# Patient Record
Sex: Male | Born: 1950 | Race: Black or African American | Hispanic: No | Marital: Single | Smoking: Former smoker
Health system: Southern US, Community
[De-identification: ages and names within clinical notes are randomized; demographics above are authoritative.]

## PROBLEM LIST (undated history)

## (undated) DIAGNOSIS — M1990 Unspecified osteoarthritis, unspecified site: Secondary | ICD-10-CM

## (undated) DIAGNOSIS — C189 Malignant neoplasm of colon, unspecified: Secondary | ICD-10-CM

## (undated) DIAGNOSIS — D649 Anemia, unspecified: Secondary | ICD-10-CM

## (undated) DIAGNOSIS — I10 Essential (primary) hypertension: Secondary | ICD-10-CM

## (undated) DIAGNOSIS — E785 Hyperlipidemia, unspecified: Secondary | ICD-10-CM

## (undated) DIAGNOSIS — R7303 Prediabetes: Secondary | ICD-10-CM

## (undated) HISTORY — PX: HERNIA REPAIR: SHX51

## (undated) HISTORY — PX: TONSILLECTOMY: SUR1361

## (undated) HISTORY — DX: Anemia, unspecified: D64.9

---

## 2021-01-22 DIAGNOSIS — C189 Malignant neoplasm of colon, unspecified: Secondary | ICD-10-CM

## 2021-01-22 HISTORY — DX: Malignant neoplasm of colon, unspecified: C18.9

## 2021-01-29 ENCOUNTER — Other Ambulatory Visit: Payer: Self-pay | Admitting: Registered Nurse

## 2021-01-29 DIAGNOSIS — R197 Diarrhea, unspecified: Secondary | ICD-10-CM

## 2021-02-01 ENCOUNTER — Other Ambulatory Visit: Payer: Self-pay

## 2021-02-01 ENCOUNTER — Ambulatory Visit
Admission: RE | Admit: 2021-02-01 | Discharge: 2021-02-01 | Disposition: A | Discharge status: Home / Self Care | Payer: Medicare HMO | Source: Ambulatory Visit | Attending: Registered Nurse | Admitting: Registered Nurse

## 2021-02-01 DIAGNOSIS — R197 Diarrhea, unspecified: Secondary | ICD-10-CM

## 2021-03-01 ENCOUNTER — Ambulatory Visit: Payer: Self-pay | Admitting: General Surgery

## 2021-03-01 NOTE — H&P (Signed)
REFERRING PHYSICIAN:  Koleen Distance, MD  PROVIDER:  Monico Blitz, MD  MRN: [id number] DOB: 08/04/1950 DATE OF ENCOUNTER: 03/01/2021  Subjective   Chief Complaint: New Consultation     History of Present Illness: Tyler Wise is a 70 y.o. male who is seen today as an office consultation at the request of Dr. Alan Ripper for evaluation of colon cancer.  Patient underwent a CT scan of the abdomen pelvis due to nausea vomiting and diarrhea associated with weight loss.  This showed a mass in the right colon.  He was sent for colonoscopy.  Biopsies show adenocarcinoma.  Mass was located in the ascending colon and tattooed distally.  There appears to also be a 5 cm polyp in the descending colon approximately 50 cm from the anal verge.  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  This was tattooed proximally and distally.  Patient also has a umbilical hernia on CT scan.  It does not appear that he has had a CEA level or CT of his chest.  He states that he has had almost a 50 pound weight loss since January.  He has had 2 months of diarrhea.  He has an appointment with a hematologist next week for further evaluation.     Review of Systems: A complete review of systems was obtained from the patient.  I have reviewed this information and discussed as appropriate with the patient.  See HPI as well for other ROS.    Medical History: Past Medical History:  Diagnosis Date   History of cancer     Patient Active Problem List  Diagnosis   Benign prostatic hyperplasia   Edema   Essential hypertension   Gastroesophageal reflux disease without esophagitis   Hyperlipidemia   Iron deficiency anemia   Need for hepatitis C screening test   Need for immunization against influenza   Need for shingles vaccine   Screen for colon cancer   Stage 3 chronic kidney disease (CMS-HCC)    History reviewed. No pertinent surgical history.   No Known  Allergies  Current Outpatient Medications on File Prior to Visit  Medication Sig Dispense Refill   amLODIPine (NORVASC) 5 MG tablet Take by mouth     aspirin 81 MG EC tablet Take by mouth     atorvastatin (LIPITOR) 10 MG tablet Take by mouth     omeprazole (PRILOSEC) 20 MG DR capsule Take by mouth     ondansetron (ZOFRAN) 4 MG tablet      tamsulosin (FLOMAX) 0.4 mg capsule Take by mouth     No current facility-administered medications on file prior to visit.    History reviewed. No pertinent family history.   Social History   Tobacco Use  Smoking Status Never Smoker  Smokeless Tobacco Never Used     Social History   Socioeconomic History   Marital status: Single  Tobacco Use   Smoking status: Never Smoker   Smokeless tobacco: Never Used  Scientific laboratory technician Use: Never used  Substance and Sexual Activity   Alcohol use: Not Currently   Drug use: Defer    Objective:    Vitals:   03/01/21 1009  BP: 122/78  Pulse: (!) 115  Temp: 36.5 C (97.7 F)  Weight: 95.3 kg (210 lb 3.2 oz)  Height: 175.3 cm ('5\' 9"'$ )     Exam Gen: NAD Abd: soft, periumbilical hernia, reducible    Labs, Imaging and Diagnostic Testing: CT abd and pelvis reviewed.  This shows a large mass in the ascending colon just proximal to the hepatic flexure.  There does not appear to be any involvement of the duodenum or Glisson's capsule.  Assessment and Plan:  Diagnoses and all orders for this visit:  Malignant neoplasm of ascending colon (CMS-HCC) 70 year old male who presents to the office with a new ascending colon cancer.  I have recommended laparoscopic partial colectomy for resection.  We have discussed this in detail including risk of bleeding, infection and damage to adjacent structures.  We will plan on doing the surgery through his umbilical hernia and repair this primarily at the end of the procedure.  We discussed approximately 50% recurrence rate of his hernia.  We will get a CT scan of  his chest and a CEA level prior to surgery to complete his metastatic work-up. The surgery and anatomy were described to the patient as well as the risks of surgery and the possible complications.  These include: Bleeding, deep abdominal infections and possible wound complications such as hernia and infection, damage to adjacent structures, leak of surgical connections, which can lead to other surgeries and possibly an ostomy, possible need for other procedures, such as abscess drains in radiology, possible prolonged hospital stay, possible diarrhea from removal of part of the colon, possible constipation from narcotics, possible bowel, bladder or sexual dysfunction if having rectal surgery, prolonged fatigue/weakness or appetite loss, possible early recurrence of of disease, possible complications of their medical problems such as heart disease or arrhythmias or lung problems, death (less than 1%). I believe the patient understands and wishes to proceed with the surgery.  Adenomatous polyp of descending colon  Patient also has a 5 cm polyp of the descending colon.  There is no sign of malignancy on pathology.  He does have high-grade dysplasia.  It appears in discussing with the patient that he is being evaluated for possible endoscopic resection.  I think this is reasonable to attempt.  If for some reason this cannot be completed, we can proceed with a repeat colonic resection of the left side approximately 3 to 6 months after surgery.

## 2021-03-01 NOTE — H&P (View-Only) (Signed)
REFERRING PHYSICIAN:  Koleen Distance, MD  PROVIDER:  Monico Blitz, MD  MRN: [id number] DOB: [birth year] DATE OF ENCOUNTER: 03/01/2021  Subjective   Chief Complaint: New Consultation     History of Present Illness: Tyler Wise is a 70 y.o. male who is seen today as an office consultation at the request of Dr. Alan Ripper for evaluation of colon cancer.  Patient underwent a CT scan of the abdomen pelvis due to nausea vomiting and diarrhea associated with weight loss.  This showed a mass in the right colon.  He was sent for colonoscopy.  Biopsies show adenocarcinoma.  Mass was located in the ascending colon and tattooed distally.  There appears to also be a 5 cm polyp in the descending colon approximately 50 cm from the anal verge.  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  This was tattooed proximally and distally.  Patient also has a umbilical hernia on CT scan.  It does not appear that he has had a CEA level or CT of his chest.  He states that he has had almost a 50 pound weight loss since January.  He has had 2 months of diarrhea.  He has an appointment with a hematologist next week for further evaluation.     Review of Systems: A complete review of systems was obtained from the patient.  I have reviewed this information and discussed as appropriate with the patient.  See HPI as well for other ROS.    Medical History: Past Medical History:  Diagnosis Date   History of cancer     Patient Active Problem List  Diagnosis   Benign prostatic hyperplasia   Edema   Essential hypertension   Gastroesophageal reflux disease without esophagitis   Hyperlipidemia   Iron deficiency anemia   Need for hepatitis C screening test   Need for immunization against influenza   Need for shingles vaccine   Screen for colon cancer   Stage 3 chronic kidney disease (CMS-HCC)    History reviewed. No pertinent surgical history.   No Known  Allergies  Current Outpatient Medications on File Prior to Visit  Medication Sig Dispense Refill   amLODIPine (NORVASC) 5 MG tablet Take by mouth     aspirin 81 MG EC tablet Take by mouth     atorvastatin (LIPITOR) 10 MG tablet Take by mouth     omeprazole (PRILOSEC) 20 MG DR capsule Take by mouth     ondansetron (ZOFRAN) 4 MG tablet      tamsulosin (FLOMAX) 0.4 mg capsule Take by mouth     No current facility-administered medications on file prior to visit.    History reviewed. No pertinent family history.   Social History   Tobacco Use  Smoking Status Never Smoker  Smokeless Tobacco Never Used     Social History   Socioeconomic History   Marital status: Single  Tobacco Use   Smoking status: Never Smoker   Smokeless tobacco: Never Used  Scientific laboratory technician Use: Never used  Substance and Sexual Activity   Alcohol use: Not Currently   Drug use: Defer    Objective:    Vitals:   03/01/21 1009  BP: 122/78  Pulse: (!) 115  Temp: 36.5 C (97.7 F)  Weight: 95.3 kg (210 lb 3.2 oz)  Height: 175.3 cm ('5\' 9"'$ )     Exam Gen: NAD Abd: soft, periumbilical hernia, reducible    Labs, Imaging and Diagnostic Testing: CT abd and pelvis reviewed.  This shows a large mass in the ascending colon just proximal to the hepatic flexure.  There does not appear to be any involvement of the duodenum or Glisson's capsule.  Assessment and Plan:  Diagnoses and all orders for this visit:  Malignant neoplasm of ascending colon (CMS-HCC) 70 year old male who presents to the office with a new ascending colon cancer.  I have recommended laparoscopic partial colectomy for resection.  We have discussed this in detail including risk of bleeding, infection and damage to adjacent structures.  We will plan on doing the surgery through his umbilical hernia and repair this primarily at the end of the procedure.  We discussed approximately 50% recurrence rate of his hernia.  We will get a CT scan of  his chest and a CEA level prior to surgery to complete his metastatic work-up. The surgery and anatomy were described to the patient as well as the risks of surgery and the possible complications.  These include: Bleeding, deep abdominal infections and possible wound complications such as hernia and infection, damage to adjacent structures, leak of surgical connections, which can lead to other surgeries and possibly an ostomy, possible need for other procedures, such as abscess drains in radiology, possible prolonged hospital stay, possible diarrhea from removal of part of the colon, possible constipation from narcotics, possible bowel, bladder or sexual dysfunction if having rectal surgery, prolonged fatigue/weakness or appetite loss, possible early recurrence of of disease, possible complications of their medical problems such as heart disease or arrhythmias or lung problems, death (less than 1%). I believe the patient understands and wishes to proceed with the surgery.  Adenomatous polyp of descending colon  Patient also has a 5 cm polyp of the descending colon.  There is no sign of malignancy on pathology.  He does have high-grade dysplasia.  It appears in discussing with the patient that he is being evaluated for possible endoscopic resection.  I think this is reasonable to attempt.  If for some reason this cannot be completed, we can proceed with a repeat colonic resection of the left side approximately 3 to 6 months after surgery.

## 2021-03-04 NOTE — Patient Instructions (Addendum)
DUE TO COVID-19 ONLY ONE VISITOR IS ALLOWED TO COME WITH YOU AND STAY IN THE WAITING ROOM ONLY DURING PRE OP AND PROCEDURE.   **NO VISITORS ARE ALLOWED IN THE SHORT STAY AREA OR RECOVERY ROOM!!**  IF YOU WILL BE ADMITTED INTO THE HOSPITAL YOU ARE ALLOWED ONLY TWO SUPPORT PEOPLE DURING VISITATION HOURS ONLY (10AM -8PM)   The support person(s) may change daily. The support person(s) must pass our screening, gel in and out, and wear a mask at all times, including in the patient's room. Patients must also wear a mask when staff or their support person are in the room.  No visitors under the age of 86. Any visitor under the age of 2 must be accompanied by an adult.    COVID SWAB TESTING MUST BE COMPLETED ON:  Monday 03-08-21 between the hours of 8 and 3 **MUST PRESENT COMPLETED FORM AT TESTING SITE**    Tyler Wise (backside of the building)  You are not required to quarantine, however you are required to wear a well-fitted mask when you are out and around people not in your household.  Hand Hygiene often Do NOT share personal items Notify your provider if you are in close contact with someone who has COVID or you develop fever 100.4 or greater, new onset of sneezing, cough, sore throat, shortness of breath or body aches.   Your procedure is scheduled on:  Wednesday, 03-10-21   Report to [facility] Main  Entrance   Report to admitting at 9:30 AM   Call this number if you have problems the morning of surgery [phone]   Follow prep instructions from surgeon's office   Do not eat food :After Midnight.   May have liquids until 8:30 AM day of surgery  CLEAR LIQUID DIET  Foods Allowed                                                                     Foods Excluded  Water, Black Coffee and tea, regular and decaf               liquids that you cannot  Plain Jell-O in any flavor  (No red)                                    see through such as: Fruit  ices (not with fruit pulp)                                      milk, soups, orange juice              Iced Popsicles (No red)                                      All solid food                                   Apple juices  Sports drinks like Gatorade (No red) Lightly seasoned clear broth or consume(fat free) Sugar, honey syrup       Drink 2 Ensure drinks the night before surgery, have completed by 10 PM.    Complete one Ensure drink the morning of surgery 3 hours prior to scheduled surgery, have completed by 8:30 AM.     The day of surgery:  Drink ONE (1) Pre-Surgery Clear Ensure the morning of surgery. Drink in one sitting. Do not sip.  This drink was given to you during your hospital  pre-op appointment visit. Nothing else to drink after completing the Pre-Surgery Clear Ensure .          If you have questions, please contact your surgeon's office.     Oral Hygiene is also important to reduce your risk of infection.                                    Remember - BRUSH YOUR TEETH THE MORNING OF SURGERY WITH YOUR REGULAR TOOTHPASTE   Do NOT smoke after Midnight   Take these medicines the morning of surgery with A SIP OF WATER:  Omeprazole, Tamsulosin                              You may not have any metal on your body including jewelry, and body piercing             Do not wear lotions, powders, perfumes/cologne, or deodorant              Men may shave face and neck.   Do not bring valuables to the hospital. Tyler Wise.   Contacts, dentures or bridgework may not be worn into surgery.   Bring small overnight bag day of surgery.    Please read over the following fact sheets you were given: IF YOU HAVE QUESTIONS ABOUT YOUR PRE OP INSTRUCTIONS PLEASE CALL [facility] - Preparing for Surgery Before surgery, you can play an important role.  Because skin is not sterile, your skin needs to be as free of germs as  possible.  You can reduce the number of germs on your skin by washing with CHG (chlorahexidine gluconate) soap before surgery.  CHG is an antiseptic cleaner which kills germs and bonds with the skin to continue killing germs even after washing. Please DO NOT use if you have an allergy to CHG or antibacterial soaps.  If your skin becomes reddened/irritated stop using the CHG and inform your nurse when you arrive at Short Stay. Do not shave (including legs and underarms) for at least 48 hours prior to the first CHG shower.  You may shave your face/neck.  Please follow these instructions carefully:  1.  Shower with CHG Soap the night before surgery and the  morning of surgery.  2.  If you choose to wash your hair, wash your hair first as usual with your normal  shampoo.  3.  After you shampoo, rinse your hair and body thoroughly to remove the shampoo.                             4.  Use CHG as you would any other liquid soap.  You can apply chg directly to  the skin and wash.  Gently with a scrungie or clean washcloth.  5.  Apply the CHG Soap to your body ONLY FROM THE NECK DOWN.   Do   not use on face/ open                           Wound or open sores. Avoid contact with eyes, ears mouth and   genitals (private parts).                       Wash face,  Genitals (private parts) with your normal soap.             6.  Wash thoroughly, paying special attention to the area where your    surgery  will be performed.  7.  Thoroughly rinse your body with warm water from the neck down.  8.  DO NOT shower/wash with your normal soap after using and rinsing off the CHG Soap.                9.  Pat yourself dry with a clean towel.            10.  Wear clean pajamas.            11.  Place clean sheets on your bed the night of your first shower and do not  sleep with pets. Day of Surgery : Do not apply any lotions/deodorants the morning of surgery.  Please wear clean clothes to the hospital/surgery  center.  FAILURE TO FOLLOW THESE INSTRUCTIONS MAY RESULT IN THE CANCELLATION OF YOUR SURGERY  PATIENT SIGNATURE_________________________________  NURSE SIGNATURE__________________________________  ________________________________________________________________________  WHAT IS A BLOOD TRANSFUSION? Blood Transfusion Information  A transfusion is the replacement of blood or some of its parts. Blood is made up of multiple cells which provide different functions. Red blood cells carry oxygen and are used for blood loss replacement. White blood cells fight against infection. Platelets control bleeding. Plasma helps clot blood. Other blood products are available for specialized needs, such as hemophilia or other clotting disorders. BEFORE THE TRANSFUSION  Who gives blood for transfusions?  Healthy volunteers who are fully evaluated to make sure their blood is safe. This is blood bank blood. Transfusion therapy is the safest it has ever been in the practice of medicine. Before blood is taken from a donor, a complete history is taken to make sure that person has no history of diseases nor engages in risky social behavior (examples are intravenous drug use or sexual activity with multiple partners). The donor's travel history is screened to minimize risk of transmitting infections, such as malaria. The donated blood is tested for signs of infectious diseases, such as HIV and hepatitis. The blood is then tested to be sure it is compatible with you in order to minimize the chance of a transfusion reaction. If you or a relative donates blood, this is often done in anticipation of surgery and is not appropriate for emergency situations. It takes many days to process the donated blood. RISKS AND COMPLICATIONS Although transfusion therapy is very safe and saves many lives, the main dangers of transfusion include:  Getting an infectious disease. Developing a transfusion reaction. This is an allergic  reaction to something in the blood you were given. Every precaution is taken to prevent this. The decision to have a blood transfusion has been considered carefully by your caregiver before blood is given. Blood is not  given unless the benefits outweigh the risks. AFTER THE TRANSFUSION Right after receiving a blood transfusion, you will usually feel much better and more energetic. This is especially true if your red blood cells have gotten low (anemic). The transfusion raises the level of the red blood cells which carry oxygen, and this usually causes an energy increase. The nurse administering the transfusion will monitor you carefully for complications. HOME CARE INSTRUCTIONS  No special instructions are needed after a transfusion. You may find your energy is better. Speak with your caregiver about any limitations on activity for underlying diseases you may have. SEEK MEDICAL CARE IF:  Your condition is not improving after your transfusion. You develop redness or irritation at the intravenous (IV) site. SEEK IMMEDIATE MEDICAL CARE IF:  Any of the following symptoms occur over the next 12 hours: Shaking chills. You have a temperature by mouth above 102 F (38.9 C), not controlled by medicine. Chest, back, or muscle pain. People around you feel you are not acting correctly or are confused. Shortness of breath or difficulty breathing. Dizziness and fainting. You get a rash or develop hives. You have a decrease in urine output. Your urine turns a dark color or changes to pink, red, or brown. Any of the following symptoms occur over the next 10 days: You have a temperature by mouth above 102 F (38.9 C), not controlled by medicine. Shortness of breath. Weakness after normal activity. The white part of the eye turns yellow (jaundice). You have a decrease in the amount of urine or are urinating less often. Your urine turns a dark color or changes to pink, red, or brown. Document Released:  07/08/2000 Document Revised: 10/03/2011 Document Reviewed: 02/25/2008 [facility] Patient Information 2014 [place], [place].  _______________________________________________________________________

## 2021-03-05 ENCOUNTER — Other Ambulatory Visit: Payer: Self-pay | Admitting: General Surgery

## 2021-03-05 ENCOUNTER — Other Ambulatory Visit (HOSPITAL_COMMUNITY): Payer: Self-pay

## 2021-03-05 DIAGNOSIS — C182 Malignant neoplasm of ascending colon: Secondary | ICD-10-CM

## 2021-03-08 ENCOUNTER — Other Ambulatory Visit: Payer: Self-pay | Admitting: General Surgery

## 2021-03-08 ENCOUNTER — Encounter (HOSPITAL_COMMUNITY)
Admission: RE | Admit: 2021-03-08 | Discharge: 2021-03-08 | Disposition: A | Discharge status: Home / Self Care | Payer: Medicare HMO | Source: Ambulatory Visit | Attending: General Surgery | Admitting: General Surgery

## 2021-03-08 ENCOUNTER — Encounter (HOSPITAL_COMMUNITY): Payer: Self-pay

## 2021-03-08 ENCOUNTER — Other Ambulatory Visit: Payer: Self-pay

## 2021-03-08 DIAGNOSIS — C189 Malignant neoplasm of colon, unspecified: Secondary | ICD-10-CM | POA: Insufficient documentation

## 2021-03-08 DIAGNOSIS — Z7982 Long term (current) use of aspirin: Secondary | ICD-10-CM | POA: Insufficient documentation

## 2021-03-08 DIAGNOSIS — I10 Essential (primary) hypertension: Secondary | ICD-10-CM | POA: Insufficient documentation

## 2021-03-08 DIAGNOSIS — I700 Atherosclerosis of aorta: Secondary | ICD-10-CM | POA: Insufficient documentation

## 2021-03-08 DIAGNOSIS — R000 Tachycardia, unspecified: Secondary | ICD-10-CM | POA: Insufficient documentation

## 2021-03-08 DIAGNOSIS — Z79899 Other long term (current) drug therapy: Secondary | ICD-10-CM | POA: Insufficient documentation

## 2021-03-08 DIAGNOSIS — R7303 Prediabetes: Secondary | ICD-10-CM | POA: Insufficient documentation

## 2021-03-08 DIAGNOSIS — Z01812 Encounter for preprocedural laboratory examination: Secondary | ICD-10-CM | POA: Insufficient documentation

## 2021-03-08 HISTORY — DX: Essential (primary) hypertension: I10

## 2021-03-08 HISTORY — DX: Prediabetes: R73.03

## 2021-03-08 HISTORY — DX: Hyperlipidemia, unspecified: E78.5

## 2021-03-08 HISTORY — DX: Malignant neoplasm of colon, unspecified: C18.9

## 2021-03-08 LAB — BASIC METABOLIC PANEL
Anion gap: 9 (ref 5–15)
BUN: 10 mg/dL (ref 8–23)
CO2: 26 mmol/L (ref 22–32)
Calcium: 8.4 mg/dL — ABNORMAL LOW (ref 8.9–10.3)
Chloride: 105 mmol/L (ref 98–111)
Creatinine, Ser: 1.45 mg/dL — ABNORMAL HIGH (ref 0.61–1.24)
GFR, Estimated: 52 mL/min — ABNORMAL LOW (ref >60)
Glucose, Bld: 138 mg/dL — ABNORMAL HIGH (ref 70–99)
Potassium: 3.5 mmol/L (ref 3.5–5.1)
Sodium: 140 mmol/L (ref 135–145)

## 2021-03-08 LAB — CBC
HCT: 42.8 % (ref 39.0–52.0)
Hemoglobin: 13.1 g/dL (ref 13.0–17.0)
MCH: 21.6 pg — ABNORMAL LOW (ref 26.0–34.0)
MCHC: 30.6 g/dL (ref 30.0–36.0)
MCV: 70.6 fL — ABNORMAL LOW (ref 80.0–100.0)
Platelets: 611 10*3/uL — ABNORMAL HIGH (ref 150–400)
RBC: 6.06 MIL/uL — ABNORMAL HIGH (ref 4.22–5.81)
RDW: 22.2 % — ABNORMAL HIGH (ref 11.5–15.5)
WBC: 13.9 10*3/uL — ABNORMAL HIGH (ref 4.0–10.5)
nRBC: 0 % (ref 0.0–0.2)

## 2021-03-08 LAB — SARS CORONAVIRUS 2 (TAT 6-24 HRS): SARS Coronavirus 2: NEGATIVE

## 2021-03-08 LAB — HEMOGLOBIN A1C
Hgb A1c MFr Bld: 6.3 % — ABNORMAL HIGH (ref 4.8–5.6)
Mean Plasma Glucose: 134.11 mg/dL

## 2021-03-08 NOTE — Progress Notes (Signed)
Anesthesia Consult:    Case: [id number] Date/Time: 03/10/21 1115   Procedure: LAPAROSCOPIC PARTIAL COLECTOMY   Anesthesia type: General   Pre-op diagnosis: colon cancer   Location: [facility] ROOM 02 / [facility]   Surgeons: Leighton Ruff, MD       DISCUSSION: Pt is 70 years old with hx HTN, pre-diabetes  Asked to see pt in presurgical testing for tachycardia, hypotension, and c/o dizziness. Pt reports did not wake up feeling dizzy. Woke up, took AM meds (including amlodipine), showered, did not eat. Daughter arrived to take him to 8am presurgical testing appointment and pt reports starting to feel dizzy at that time. Has been having episodes of dizziness lately. He reports his systolic BP used to be [id number] but has been much lower recently. Does not check BP at home. Denies falls. Has had significant weight loss in recent months (50 pounds since 07/2020) and has recurrent diarrhea and vomiting for the last 2 months. Denies chest pain or shortness of breath.   BP low at 95/63 and 94/54 on recheck. Pt symptoms improved slightly after drinking 2 small cans of ginger ale but pt still feels dizzy. I suspect his amlodipine is the main culprit for this morning's symptoms. Not eating likely also plays a factor. Asked him to contact his PCP about holding amlodipine for the short term. Reviewed with pt and daughter reasons to seek urgent/emergent care. Cautioned pt to be careful changing positions and ambulating.   On exam, HR tachycardic with no abnormal heart sounds. Lungs CTA B. Pt is alert and oriented x3.   Reviewed pt and EKG with Dr. Deatra Canter.    VS: BP 95/63   Pulse (!) 106   Temp 36.6 C (Oral)   Resp 18   Ht '5\' 9"'$  (1.753 m)   SpO2 99%   PROVIDERS: - PCP is Arthur Holms, NP   LABS: Labs reviewed: Acceptable for surgery. (all labs ordered are listed, but only abnormal results are displayed)  Labs Reviewed  HEMOGLOBIN A1C - Abnormal; Notable for the following components:      Result Value    Hgb A1c MFr Bld 6.3 (*)    All other components within normal limits  BASIC METABOLIC PANEL - Abnormal; Notable for the following components:   Glucose, Bld 138 (*)    Creatinine, Ser 1.45 (*)    Calcium 8.4 (*)    GFR, Estimated 52 (*)    All other components within normal limits  CBC - Abnormal; Notable for the following components:   WBC 13.9 (*)    RBC 6.06 (*)    MCV 70.6 (*)    MCH 21.6 (*)    RDW 22.2 (*)    Platelets 611 (*)    All other components within normal limits  CEA  TYPE AND SCREEN     IMAGES: CT chest 03/09/21:  1. No specific findings identified to suggest metastatic disease to the chest. 2. Tiny 3 mm nodule within the posterior right lower lobe is identified. Although nonspecific attention in this nodule on future surveillance imaging is advised. 3. Partially visualized right colon mass is again seen. 4. Aortic atherosclerosis.   EKG 03/08/21: Sinus tachycardia with PACs. Left axis deviation Inferior infarct, age undetermined Anterior infarct, age undetermined - No prior EKG for comparison  CV: N/A  Past Medical History:  Diagnosis Date   Colon cancer ([place])    Hyperlipidemia    Hypertension    Pre-diabetes     History reviewed. No pertinent  surgical history.  MEDICATIONS:  amLODipine (NORVASC) 5 MG tablet   aspirin EC 81 MG tablet   atorvastatin (LIPITOR) 10 MG tablet   bisacodyl (DULCOLAX) 5 MG EC tablet   omeprazole (PRILOSEC) 40 MG capsule   ondansetron (ZOFRAN) 4 MG tablet   tamsulosin (FLOMAX) 0.4 MG CAPS capsule   No current facility-administered medications for this encounter.    If no changes, I anticipate pt can proceed with surgery as scheduled.   Willeen Cass, PhD, FNP-BC [facility]/Anesthesiology Phone: [phone] 03/09/2021 11:53 AM

## 2021-03-08 NOTE — Progress Notes (Addendum)
COVID swab appointment:  COVID Vaccine Completed:  Yes x2 Date COVID Vaccine completed:  12-01-19 Has received booster:  Yes x1 06-23-20 COVID vaccine manufacturer:   [employer]   Date of COVID positive in last 90 days:  No  PCP - Arthur Holms, NP Cardiologist - N/A  Chest x-ray - N/A EKG - 03-08-21 Epic Stress Test - N/A ECHO - N/A Cardiac Cath - N/A Pacemaker/ICD device last checked: Spinal Cord Stimulator:  Sleep Study - N/A CPAP -   Fasting Blood Sugar - N/A Checks Blood Sugar _____ times a day  Blood Thinner Instructions: Aspirin Instructions:  ASA 81 mg.  Last dose 03-08-21 Last Dose:  Activity level:  Can go up a flight of stairs and perform activities of daily living without stopping and without symptoms of chest pain or shortness of breath.  Patient lives alone.      Anesthesia review:   Patient's BP 94/54 at PAT appointment, patient complained of dizziness and was diaphoretic and complained of nausea.  Patient examined by Willeen Cass, NP.  Patient drank two ginger ale and started to feel better.  BP 95/63 when rechecked, but patient stated that he felt significantly better and stated that nausea and dizziness had resolved.  Was able to ambulate to wheelchair without complaints.  Patient complains of shortness of breath while wearing mask but states that he does not have SOB normally.  Fever, cough and chest pain at PAT appointment   Patient verbalized understanding of instructions that were given to them at the PAT appointment. Patient was also instructed that they will need to review over the PAT instructions again at home before surgery.

## 2021-03-09 ENCOUNTER — Ambulatory Visit (HOSPITAL_COMMUNITY)
Admission: RE | Admit: 2021-03-09 | Discharge: 2021-03-09 | Disposition: A | Discharge status: Home / Self Care | Payer: Medicare HMO | Source: Ambulatory Visit | Attending: General Surgery | Admitting: General Surgery

## 2021-03-09 ENCOUNTER — Encounter (HOSPITAL_COMMUNITY): Payer: Self-pay

## 2021-03-09 DIAGNOSIS — C182 Malignant neoplasm of ascending colon: Secondary | ICD-10-CM | POA: Insufficient documentation

## 2021-03-09 LAB — CEA: CEA: 29.4 ng/mL — ABNORMAL HIGH (ref 0.0–4.7)

## 2021-03-09 MED ORDER — IOHEXOL 350 MG/ML SOLN
60.0000 mL | Freq: Once | INTRAVENOUS | Status: AC | PRN
  Administered 2021-03-09: 60 mL via INTRAVENOUS

## 2021-03-09 MED ORDER — BUPIVACAINE LIPOSOME 1.3 % IJ SUSP
20.0000 mL | Freq: Once | INTRAMUSCULAR | Status: DC
  Filled 2021-03-09: qty 20

## 2021-03-09 NOTE — Anesthesia Preprocedure Evaluation (Addendum)
Anesthesia Evaluation  Patient identified by MRN, date of birth, ID band Patient awake    Reviewed: Allergy & Precautions, NPO status , Patient's Chart, lab work & pertinent test results  Airway Mallampati: II  TM Distance: >3 FB     Dental  (+) Dental Advisory Given   Pulmonary former smoker,    breath sounds clear to auscultation       Cardiovascular hypertension,  Rhythm:Regular Rate:Normal     Neuro/Psych negative neurological ROS     GI/Hepatic Neg liver ROS, GERD  Medicated,Colon CA   Endo/Other  negative endocrine ROS  Renal/GU Renal InsufficiencyRenal disease     Musculoskeletal   Abdominal   Peds  Hematology negative hematology ROS (+)   Anesthesia Other Findings   Reproductive/Obstetrics                            Anesthesia Physical Anesthesia Plan  ASA: 3  Anesthesia Plan: General   Post-op Pain Management:    Induction: Intravenous  PONV Risk Score and Plan: 3 and Dexamethasone, Ondansetron, Treatment may vary due to age or medical condition and Midazolam  Airway Management Planned: Oral ETT  Additional Equipment: None  Intra-op Plan:   Post-operative Plan: Extubation in OR  Informed Consent: I have reviewed the patients History and Physical, chart, labs and discussed the procedure including the risks, benefits and alternatives for the proposed anesthesia with the patient or authorized representative who has indicated his/her understanding and acceptance.     Dental advisory given  Plan Discussed with: CRNA  Anesthesia Plan Comments:        Anesthesia Quick Evaluation

## 2021-03-10 ENCOUNTER — Encounter (HOSPITAL_COMMUNITY): Payer: Self-pay | Admitting: General Surgery

## 2021-03-10 ENCOUNTER — Other Ambulatory Visit: Payer: Self-pay

## 2021-03-10 ENCOUNTER — Inpatient Hospital Stay (HOSPITAL_COMMUNITY): Payer: Medicare HMO | Admitting: Registered Nurse

## 2021-03-10 ENCOUNTER — Inpatient Hospital Stay (HOSPITAL_COMMUNITY)
Admission: RE | Admit: 2021-03-10 | Discharge: 2021-03-15 | DRG: 330 | Disposition: A | Discharge status: Home / Self Care | Payer: Medicare HMO | Attending: General Surgery | Admitting: General Surgery

## 2021-03-10 ENCOUNTER — Encounter (HOSPITAL_COMMUNITY)
Admission: RE | Disposition: A | Discharge status: Home / Self Care | Payer: Self-pay | Source: Home / Self Care | Attending: General Surgery

## 2021-03-10 ENCOUNTER — Inpatient Hospital Stay (HOSPITAL_COMMUNITY): Payer: Medicare HMO | Admitting: Emergency Medicine

## 2021-03-10 DIAGNOSIS — C182 Malignant neoplasm of ascending colon: Secondary | ICD-10-CM | POA: Diagnosis present

## 2021-03-10 DIAGNOSIS — I129 Hypertensive chronic kidney disease with stage 1 through stage 4 chronic kidney disease, or unspecified chronic kidney disease: Secondary | ICD-10-CM | POA: Diagnosis present

## 2021-03-10 DIAGNOSIS — E785 Hyperlipidemia, unspecified: Secondary | ICD-10-CM | POA: Diagnosis present

## 2021-03-10 DIAGNOSIS — D124 Benign neoplasm of descending colon: Secondary | ICD-10-CM | POA: Diagnosis present

## 2021-03-10 DIAGNOSIS — Z7982 Long term (current) use of aspirin: Secondary | ICD-10-CM | POA: Diagnosis not present

## 2021-03-10 DIAGNOSIS — K420 Umbilical hernia with obstruction, without gangrene: Secondary | ICD-10-CM | POA: Diagnosis present

## 2021-03-10 DIAGNOSIS — K219 Gastro-esophageal reflux disease without esophagitis: Secondary | ICD-10-CM | POA: Diagnosis present

## 2021-03-10 DIAGNOSIS — N183 Chronic kidney disease, stage 3 unspecified: Secondary | ICD-10-CM | POA: Diagnosis present

## 2021-03-10 DIAGNOSIS — Z79899 Other long term (current) drug therapy: Secondary | ICD-10-CM

## 2021-03-10 HISTORY — PX: LAPAROSCOPIC PARTIAL COLECTOMY: SHX5907

## 2021-03-10 LAB — TYPE AND SCREEN
ABO/RH(D): A POS
Antibody Screen: NEGATIVE

## 2021-03-10 LAB — ABO/RH: ABO/RH(D): A POS

## 2021-03-10 SURGERY — LAPAROSCOPIC PARTIAL COLECTOMY
Anesthesia: General | Site: Abdomen

## 2021-03-10 MED ORDER — MIDAZOLAM HCL 5 MG/5ML IJ SOLN
INTRAMUSCULAR | Status: DC | PRN
  Administered 2021-03-10: 2 mg via INTRAVENOUS

## 2021-03-10 MED ORDER — BUPIVACAINE LIPOSOME 1.3 % IJ SUSP
INTRAMUSCULAR | Status: DC | PRN
  Administered 2021-03-10: 20 mL

## 2021-03-10 MED ORDER — EPHEDRINE SULFATE-NACL 50-0.9 MG/10ML-% IV SOSY
PREFILLED_SYRINGE | INTRAVENOUS | Status: DC | PRN
  Administered 2021-03-10 (×2): 5 mg via INTRAVENOUS

## 2021-03-10 MED ORDER — SODIUM CHLORIDE 0.9 % IV SOLN
2.0000 g | Freq: Two times a day (BID) | INTRAVENOUS | Status: AC
  Administered 2021-03-10: 2 g via INTRAVENOUS
  Filled 2021-03-10: qty 2

## 2021-03-10 MED ORDER — ROCURONIUM BROMIDE 10 MG/ML (PF) SYRINGE
PREFILLED_SYRINGE | INTRAVENOUS | Status: AC
  Filled 2021-03-10: qty 10

## 2021-03-10 MED ORDER — BUPIVACAINE-EPINEPHRINE (PF) 0.25% -1:200000 IJ SOLN
INTRAMUSCULAR | Status: AC
  Filled 2021-03-10: qty 30

## 2021-03-10 MED ORDER — PHENYLEPHRINE HCL (PRESSORS) 10 MG/ML IV SOLN
INTRAVENOUS | Status: AC
  Filled 2021-03-10: qty 2

## 2021-03-10 MED ORDER — PROPOFOL 10 MG/ML IV BOLUS
INTRAVENOUS | Status: AC
  Filled 2021-03-10: qty 20

## 2021-03-10 MED ORDER — CHLORHEXIDINE GLUCONATE 0.12 % MT SOLN
15.0000 mL | Freq: Once | OROMUCOSAL | Status: AC
  Administered 2021-03-10: 15 mL via OROMUCOSAL

## 2021-03-10 MED ORDER — ENOXAPARIN SODIUM 40 MG/0.4ML IJ SOSY
40.0000 mg | PREFILLED_SYRINGE | INTRAMUSCULAR | Status: DC
  Administered 2021-03-11 – 2021-03-15 (×5): 40 mg via SUBCUTANEOUS
  Filled 2021-03-10 (×5): qty 0.4

## 2021-03-10 MED ORDER — ACETAMINOPHEN 500 MG PO TABS
1000.0000 mg | ORAL_TABLET | ORAL | Status: AC
  Administered 2021-03-10: 1000 mg via ORAL
  Filled 2021-03-10: qty 2

## 2021-03-10 MED ORDER — MIDAZOLAM HCL 2 MG/2ML IJ SOLN
INTRAMUSCULAR | Status: AC
  Filled 2021-03-10: qty 2

## 2021-03-10 MED ORDER — GABAPENTIN 300 MG PO CAPS
300.0000 mg | ORAL_CAPSULE | Freq: Two times a day (BID) | ORAL | Status: DC
  Administered 2021-03-10 – 2021-03-15 (×10): 300 mg via ORAL
  Filled 2021-03-10 (×10): qty 1

## 2021-03-10 MED ORDER — KCL IN DEXTROSE-NACL 20-5-0.45 MEQ/L-%-% IV SOLN
INTRAVENOUS | Status: DC
  Filled 2021-03-10: qty 1000

## 2021-03-10 MED ORDER — PHENYLEPHRINE 40 MCG/ML (10ML) SYRINGE FOR IV PUSH (FOR BLOOD PRESSURE SUPPORT)
PREFILLED_SYRINGE | INTRAVENOUS | Status: AC
  Filled 2021-03-10: qty 10

## 2021-03-10 MED ORDER — PHENYLEPHRINE HCL-NACL 20-0.9 MG/250ML-% IV SOLN
INTRAVENOUS | Status: DC | PRN
  Administered 2021-03-10: 20 ug/min via INTRAVENOUS

## 2021-03-10 MED ORDER — LIDOCAINE 2% (20 MG/ML) 5 ML SYRINGE
INTRAMUSCULAR | Status: DC | PRN
  Administered 2021-03-10: 1.5 mg/kg/h via INTRAVENOUS
  Administered 2021-03-10: 60 mg via INTRAVENOUS

## 2021-03-10 MED ORDER — AMLODIPINE BESYLATE 5 MG PO TABS
5.0000 mg | ORAL_TABLET | Freq: Every day | ORAL | Status: DC
  Administered 2021-03-10 – 2021-03-15 (×6): 5 mg via ORAL
  Filled 2021-03-10 (×6): qty 1

## 2021-03-10 MED ORDER — DEXAMETHASONE SODIUM PHOSPHATE 10 MG/ML IJ SOLN
INTRAMUSCULAR | Status: DC | PRN
  Administered 2021-03-10: 8 mg via INTRAVENOUS

## 2021-03-10 MED ORDER — ENSURE SURGERY PO LIQD
237.0000 mL | Freq: Two times a day (BID) | ORAL | Status: DC
  Administered 2021-03-11 (×2): 237 mL via ORAL

## 2021-03-10 MED ORDER — ENSURE PRE-SURGERY PO LIQD: 296.0000 mL | Freq: Once | ORAL | Status: DC

## 2021-03-10 MED ORDER — LACTATED RINGERS IV SOLN: INTRAVENOUS | Status: DC

## 2021-03-10 MED ORDER — DIPHENHYDRAMINE HCL 50 MG/ML IJ SOLN: 12.5000 mg | Freq: Four times a day (QID) | INTRAMUSCULAR | Status: DC | PRN

## 2021-03-10 MED ORDER — PHENYLEPHRINE 40 MCG/ML (10ML) SYRINGE FOR IV PUSH (FOR BLOOD PRESSURE SUPPORT)
PREFILLED_SYRINGE | INTRAVENOUS | Status: DC | PRN
  Administered 2021-03-10 (×3): 80 ug via INTRAVENOUS

## 2021-03-10 MED ORDER — LIDOCAINE 2% (20 MG/ML) 5 ML SYRINGE
INTRAMUSCULAR | Status: AC
  Filled 2021-03-10: qty 5

## 2021-03-10 MED ORDER — FENTANYL CITRATE (PF) 250 MCG/5ML IJ SOLN
INTRAMUSCULAR | Status: AC
  Filled 2021-03-10: qty 5

## 2021-03-10 MED ORDER — SACCHAROMYCES BOULARDII 250 MG PO CAPS
250.0000 mg | ORAL_CAPSULE | Freq: Two times a day (BID) | ORAL | Status: DC
  Administered 2021-03-10 – 2021-03-15 (×10): 250 mg via ORAL
  Filled 2021-03-10 (×10): qty 1

## 2021-03-10 MED ORDER — SUGAMMADEX SODIUM 200 MG/2ML IV SOLN
INTRAVENOUS | Status: DC | PRN
  Administered 2021-03-10: 200 mg via INTRAVENOUS

## 2021-03-10 MED ORDER — ONDANSETRON HCL 4 MG/2ML IJ SOLN
INTRAMUSCULAR | Status: AC
  Filled 2021-03-10: qty 2

## 2021-03-10 MED ORDER — SUCCINYLCHOLINE CHLORIDE 200 MG/10ML IV SOSY
PREFILLED_SYRINGE | INTRAVENOUS | Status: DC | PRN
  Administered 2021-03-10: 100 mg via INTRAVENOUS

## 2021-03-10 MED ORDER — FENTANYL CITRATE (PF) 250 MCG/5ML IJ SOLN
INTRAMUSCULAR | Status: DC | PRN
  Administered 2021-03-10: 100 ug via INTRAVENOUS
  Administered 2021-03-10: 50 ug via INTRAVENOUS

## 2021-03-10 MED ORDER — BUPIVACAINE-EPINEPHRINE 0.25% -1:200000 IJ SOLN
INTRAMUSCULAR | Status: DC | PRN
  Administered 2021-03-10: 30 mL

## 2021-03-10 MED ORDER — EPHEDRINE 5 MG/ML INJ
INTRAVENOUS | Status: AC
  Filled 2021-03-10: qty 5

## 2021-03-10 MED ORDER — ENSURE PRE-SURGERY PO LIQD: 592.0000 mL | Freq: Once | ORAL | Status: DC

## 2021-03-10 MED ORDER — PANTOPRAZOLE SODIUM 40 MG PO TBEC
80.0000 mg | DELAYED_RELEASE_TABLET | Freq: Every day | ORAL | Status: DC
  Administered 2021-03-10 – 2021-03-15 (×6): 80 mg via ORAL
  Filled 2021-03-10 (×6): qty 2

## 2021-03-10 MED ORDER — SODIUM CHLORIDE 0.9 % IR SOLN
Status: DC | PRN
  Administered 2021-03-10: 2000 mL

## 2021-03-10 MED ORDER — ONDANSETRON HCL 4 MG/2ML IJ SOLN
4.0000 mg | Freq: Four times a day (QID) | INTRAMUSCULAR | Status: DC | PRN
  Administered 2021-03-13 – 2021-03-14 (×2): 4 mg via INTRAVENOUS
  Filled 2021-03-10 (×3): qty 2

## 2021-03-10 MED ORDER — LIDOCAINE HCL 2 % IJ SOLN
INTRAMUSCULAR | Status: AC
  Filled 2021-03-10: qty 20

## 2021-03-10 MED ORDER — ALUM & MAG HYDROXIDE-SIMETH 200-200-20 MG/5ML PO SUSP: 30.0000 mL | Freq: Four times a day (QID) | ORAL | Status: DC | PRN

## 2021-03-10 MED ORDER — OXYCODONE HCL 5 MG PO TABS
5.0000 mg | ORAL_TABLET | ORAL | Status: DC | PRN
  Administered 2021-03-11: 10 mg via ORAL
  Filled 2021-03-10 (×2): qty 2

## 2021-03-10 MED ORDER — FENTANYL CITRATE (PF) 100 MCG/2ML IJ SOLN: 25.0000 ug | INTRAMUSCULAR | Status: DC | PRN

## 2021-03-10 MED ORDER — SODIUM CHLORIDE 0.9 % IV SOLN
2.0000 g | INTRAVENOUS | Status: AC
  Administered 2021-03-10: 2 g via INTRAVENOUS
  Filled 2021-03-10: qty 2

## 2021-03-10 MED ORDER — ROCURONIUM BROMIDE 10 MG/ML (PF) SYRINGE
PREFILLED_SYRINGE | INTRAVENOUS | Status: DC | PRN
  Administered 2021-03-10: 10 mg via INTRAVENOUS
  Administered 2021-03-10: 50 mg via INTRAVENOUS

## 2021-03-10 MED ORDER — HYDROMORPHONE HCL 1 MG/ML IJ SOLN
0.5000 mg | INTRAMUSCULAR | Status: DC | PRN
  Administered 2021-03-14: 0.5 mg via INTRAVENOUS
  Filled 2021-03-10: qty 0.5

## 2021-03-10 MED ORDER — ONDANSETRON HCL 4 MG PO TABS: 4.0000 mg | ORAL_TABLET | Freq: Four times a day (QID) | ORAL | Status: DC | PRN

## 2021-03-10 MED ORDER — ORAL CARE MOUTH RINSE: 15.0000 mL | Freq: Once | OROMUCOSAL | Status: AC

## 2021-03-10 MED ORDER — ONDANSETRON HCL 4 MG/2ML IJ SOLN
INTRAMUSCULAR | Status: DC | PRN
  Administered 2021-03-10: 4 mg via INTRAVENOUS

## 2021-03-10 MED ORDER — ALVIMOPAN 12 MG PO CAPS
12.0000 mg | ORAL_CAPSULE | ORAL | Status: AC
  Administered 2021-03-10: 12 mg via ORAL
  Filled 2021-03-10: qty 1

## 2021-03-10 MED ORDER — DEXAMETHASONE SODIUM PHOSPHATE 10 MG/ML IJ SOLN
INTRAMUSCULAR | Status: AC
  Filled 2021-03-10: qty 1

## 2021-03-10 MED ORDER — GABAPENTIN 300 MG PO CAPS
300.0000 mg | ORAL_CAPSULE | ORAL | Status: AC
  Administered 2021-03-10: 300 mg via ORAL
  Filled 2021-03-10: qty 1

## 2021-03-10 MED ORDER — ALVIMOPAN 12 MG PO CAPS
12.0000 mg | ORAL_CAPSULE | Freq: Two times a day (BID) | ORAL | Status: DC
  Administered 2021-03-11: 12 mg via ORAL
  Filled 2021-03-10 (×2): qty 1

## 2021-03-10 MED ORDER — TAMSULOSIN HCL 0.4 MG PO CAPS
0.8000 mg | ORAL_CAPSULE | Freq: Every day | ORAL | Status: DC
  Administered 2021-03-11 – 2021-03-15 (×5): 0.8 mg via ORAL
  Filled 2021-03-10 (×5): qty 2

## 2021-03-10 MED ORDER — ACETAMINOPHEN 500 MG PO TABS
1000.0000 mg | ORAL_TABLET | Freq: Four times a day (QID) | ORAL | Status: DC
Start: 2021-03-10 — End: 2021-03-15
  Administered 2021-03-10 – 2021-03-15 (×16): 1000 mg via ORAL
  Filled 2021-03-10 (×17): qty 2

## 2021-03-10 MED ORDER — PROPOFOL 10 MG/ML IV BOLUS
INTRAVENOUS | Status: DC | PRN
Start: 2021-03-10 — End: 2021-03-10
  Administered 2021-03-10: 140 mg via INTRAVENOUS

## 2021-03-10 MED ORDER — DIPHENHYDRAMINE HCL 12.5 MG/5ML PO ELIX: 12.5000 mg | ORAL_SOLUTION | Freq: Four times a day (QID) | ORAL | Status: DC | PRN

## 2021-03-10 MED ORDER — LACTATED RINGERS IR SOLN
Status: DC | PRN
  Administered 2021-03-10: 1000 mL

## 2021-03-10 MED ORDER — AMISULPRIDE (ANTIEMETIC) 5 MG/2ML IV SOLN: 10.0000 mg | Freq: Once | INTRAVENOUS | Status: DC | PRN

## 2021-03-10 MED ORDER — BOOST / RESOURCE BREEZE PO LIQD CUSTOM
1.0000 | Freq: Three times a day (TID) | ORAL | Status: DC
  Administered 2021-03-10 – 2021-03-11 (×4): 1 via ORAL

## 2021-03-10 SURGICAL SUPPLY — 67 items
APPLIER CLIP 5 13 M/L LIGAMAX5 (MISCELLANEOUS)
BAG COUNTER SPONGE SURGICOUNT (BAG) ×2 IMPLANT
BINDER ABDOMINAL 12 ML 46-62 (SOFTGOODS) ×2 IMPLANT
BLADE EXTENDED COATED 6.5IN (ELECTRODE) IMPLANT
CABLE HIGH FREQUENCY MONO STRZ (ELECTRODE) IMPLANT
CELLS DAT CNTRL 66122 CELL SVR (MISCELLANEOUS) IMPLANT
CHLORAPREP W/TINT 26 (MISCELLANEOUS) ×2 IMPLANT
CLIP APPLIE 5 13 M/L LIGAMAX5 (MISCELLANEOUS) IMPLANT
DECANTER SPIKE VIAL GLASS SM (MISCELLANEOUS) IMPLANT
DERMABOND ADVANCED (GAUZE/BANDAGES/DRESSINGS) ×1
DERMABOND ADVANCED .7 DNX12 (GAUZE/BANDAGES/DRESSINGS) ×1 IMPLANT
DRAIN CHANNEL 19F RND (DRAIN) IMPLANT
DRAPE LAPAROSCOPIC ABDOMINAL (DRAPES) ×2 IMPLANT
DRAPE SURG IRRIG POUCH 19X23 (DRAPES) ×2 IMPLANT
DRSG OPSITE POSTOP 4X10 (GAUZE/BANDAGES/DRESSINGS) ×2 IMPLANT
DRSG OPSITE POSTOP 4X6 (GAUZE/BANDAGES/DRESSINGS) IMPLANT
DRSG OPSITE POSTOP 4X8 (GAUZE/BANDAGES/DRESSINGS) IMPLANT
ELECT REM PT RETURN 15FT ADLT (MISCELLANEOUS) ×2 IMPLANT
EVACUATOR SILICONE 100CC (DRAIN) IMPLANT
GAUZE SPONGE 4X4 12PLY STRL (GAUZE/BANDAGES/DRESSINGS) IMPLANT
GLOVE SURG ENC MOIS LTX SZ6.5 (GLOVE) ×4 IMPLANT
GLOVE SURG UNDER POLY LF SZ7 (GLOVE) ×4 IMPLANT
GOWN STRL REUS W/TWL XL LVL3 (GOWN DISPOSABLE) ×12 IMPLANT
GRASPER ENDOPATH ANVIL 10MM (MISCELLANEOUS) IMPLANT
HOLDER FOLEY CATH W/STRAP (MISCELLANEOUS) ×2 IMPLANT
IRRIG SUCT STRYKERFLOW 2 WTIP (MISCELLANEOUS) ×2
IRRIGATION SUCT STRKRFLW 2 WTP (MISCELLANEOUS) ×1 IMPLANT
KIT TURNOVER KIT A (KITS) ×2 IMPLANT
PACK COLON (CUSTOM PROCEDURE TRAY) ×2 IMPLANT
PAD POSITIONING PINK XL (MISCELLANEOUS) ×2 IMPLANT
PENCIL SMOKE EVACUATOR (MISCELLANEOUS) IMPLANT
PROTECTOR NERVE ULNAR (MISCELLANEOUS) ×2 IMPLANT
RELOAD PROXIMATE 75MM BLUE (ENDOMECHANICALS) ×4 IMPLANT
RTRCTR WOUND ALEXIS 18CM MED (MISCELLANEOUS)
SCISSORS LAP 5X35 DISP (ENDOMECHANICALS) ×2 IMPLANT
SEALER TISSUE G2 STRG ARTC 35C (ENDOMECHANICALS) ×2 IMPLANT
SET TUBE SMOKE EVAC HIGH FLOW (TUBING) ×2 IMPLANT
SLEEVE ADV FIXATION 5X100MM (TROCAR) ×4 IMPLANT
SLEEVE XCEL OPT CAN 5 100 (ENDOMECHANICALS) IMPLANT
SPONGE DRAIN TRACH 4X4 STRL 2S (GAUZE/BANDAGES/DRESSINGS) IMPLANT
SPONGE T-LAP 18X18 ~~LOC~~+RFID (SPONGE) IMPLANT
STAPLER GUN LINEAR PROX 60 (STAPLE) ×2 IMPLANT
STAPLER PROXIMATE 75MM BLUE (STAPLE) ×2 IMPLANT
SURGILUBE 2OZ TUBE FLIPTOP (MISCELLANEOUS) ×2 IMPLANT
SUT ETHILON 2 0 PS N (SUTURE) IMPLANT
SUT NOVA NAB DX-16 0-1 5-0 T12 (SUTURE) ×4 IMPLANT
SUT PROLENE 2 0 KS (SUTURE) IMPLANT
SUT SILK 2 0 (SUTURE) ×1
SUT SILK 2 0 SH CR/8 (SUTURE) IMPLANT
SUT SILK 2-0 18XBRD TIE 12 (SUTURE) ×1 IMPLANT
SUT SILK 3 0 (SUTURE)
SUT SILK 3 0 SH CR/8 (SUTURE) ×2 IMPLANT
SUT SILK 3-0 18XBRD TIE 12 (SUTURE) IMPLANT
SUT VIC AB 2-0 SH 18 (SUTURE) ×2 IMPLANT
SUT VIC AB 4-0 PS2 27 (SUTURE) ×2 IMPLANT
SUT VICRYL 0 UR6 27IN ABS (SUTURE) ×2 IMPLANT
SYS LAPSCP GELPORT 120MM (MISCELLANEOUS)
SYS WOUND ALEXIS 18CM MED (MISCELLANEOUS) ×2
SYSTEM LAPSCP GELPORT 120MM (MISCELLANEOUS) IMPLANT
SYSTEM WOUND ALEXIS 18CM MED (MISCELLANEOUS) ×1 IMPLANT
TOWEL OR NON WOVEN STRL DISP B (DISPOSABLE) ×2 IMPLANT
TRAY FOLEY MTR SLVR 16FR STAT (SET/KITS/TRAYS/PACK) ×2 IMPLANT
TROCAR ADV FIXATION 11X100MM (TROCAR) ×2 IMPLANT
TROCAR ADV FIXATION 5X100MM (TROCAR) ×2 IMPLANT
TROCAR BLADELESS OPT 5 100 (ENDOMECHANICALS) ×2 IMPLANT
TROCAR XCEL BLUNT TIP 100MML (ENDOMECHANICALS) IMPLANT
TUBING CONNECTING 10 (TUBING) ×4 IMPLANT

## 2021-03-10 NOTE — Op Note (Signed)
03/10/2021  1:51 PM  PATIENT:  Tyler Wise  70 y.o. male  Patient Care Team: Arthur Holms, NP as PCP - General (Nurse Practitioner)  PRE-OPERATIVE DIAGNOSIS:  colon cancer  POST-OPERATIVE DIAGNOSIS:  colon cancer  PROCEDURE:  LAPAROSCOPIC RIGHT COLECTOMY WITH A PRIMARY UMBILICAL HERNIA REPAIR    Surgeon(s): Leighton Ruff, MD Carlena Hurl, PA-C  ASSISTANT: Carlena Hurl, PA   ANESTHESIA:   local and general  EBL:  Total I/O In: 62 [I.V.:1000; IV Piggyback:100] Out: 170 [Urine:120; Blood:50]  SPECIMEN:  Source of Specimen:  hernia sac, Right colon  DISPOSITION OF SPECIMEN:  PATHOLOGY  COUNTS:  YES  PLAN OF CARE: Admit to inpatient   PATIENT DISPOSITION:  PACU - hemodynamically stable.   INDICATIONS: This is a 70 y.o. male who presented to my office with partially obstructive ascending colon cancer. The risk and benefits and alternative treatments were explained to the patient prior to the OR and the patient has elected to proceed with laparoscopic right colectomy and primary hernia repair.  Consent was signed and placed on chart prior to the OR.   OR FINDINGS: Large ascending colon mass, umbilical hernia containing incarcerated omentum  DESCRIPTION:  The patient was identified & brought into the operating room. The patient was positioned supine with arms tucked. SCDs were active during the entire case. The patient underwent general anesthesia without any difficulty. A foley catheter was inserted under sterile conditions. The abdomen was prepped and draped in a sterile fashion. A Surgical Timeout confirmed our plan.  I made an incision around the umbilical hernia using a 10 blade scalpel. I dissected down through the subcutaneous tissues and around the hernia using cautery.  I was able to identify the fascia superiorly.  The fascia was divided with cautery.  Blunt dissection was used to obtain peritoneal entry.  The hernia sac was then resected from the fascial edges using  electrocautery.  This was sent to pathology for further examination.  An alexis wound protector was placed through the hernia defect and the cap was placed over this.  The abdomen was insufflated to ~15 mmHg.  Camera inspection revealed no injury to surrounding structures.  I placed additional ports under direct laparoscopic visualization.  I evaluated the entire abdomen laparoscopically.  The liver appeared normal, the large and small bowel were normal as well.  There were no signs of metastatic disease.    I began by identifying the ileocolic artery and vein within the mesentery. Dissection was bluntly carried around these structures. The duodenum was identified and free from the structures. I then separated the structures bluntly and used the Enseal device to transect these separately.  I developed the retroperitoneal plane bluntly.  I then freed the appendix off its attachments to the pelvic wall. I mobilized the terminal ileum.  I took care to avoid injuring any retroperitoneal structures.  After this I began to mobilize laterally down the white line of Toldt and then took down the hepatic flexure using the Enseal device. I mobilized the omentum off of the right transverse colon. The entire colon was then flipped medially and mobilized off of the retroperitoneal structures until I could visualize the lateral edge of the duodenum underneath.  I gently freed the duodenal attachments.  At that point, I desufflated the abdomen and removed the wound protector cap.  The terminal ileum and right colon were then removed from the wound. The terminal ileum was transected using a GIA blue load stapler. The remaining mesentery was divided using  the Enseal device. I identified a portion of the transverse colon distal to the hepatic flexure. This was transected using another blue load GIA stapler.  An anastomosis was created between the terminal ileum and the transverse colon. This was done using a GIA blue load stapler.   The common enterotomy channel was closed using a TA 60 blue load stapler. Hemostasis was good at the staple line. Several 3-0 silk sutures were used to imbricate the edge of the anastomosis. An anti-tension suture was placed in the crotch of the anastomosis. This was then placed back into the abdomen. The abdomen was then irrigated with normal saline.  Hemostasis was good.  The omentum was then brought down over the anastomosis. The Alexis wound protector was removed, and we switched to clean instruments, gowns and drapes.   I then turned my attention to the patient's hernia.  The fascia was mobilized off the subcutaneous tissue using electrocautery.  It came together without tension.  The fascia was then closed using #1 Novafil interrupted sutures.  The subcutaneous tissue of the extraction incision was closed using interrupted 2-0 Vicryl sutures. The skin was then closed using 4-0 Vicryl sutures. Dermabond was placed on the port sites and a sterile dressing was placed over the abdominal incision. All counts were correct per operating room staff. The patient was then awakened from anesthesia and sent to the post anesthesia care unit in stable condition.

## 2021-03-10 NOTE — Transfer of Care (Signed)
Immediate Anesthesia Transfer of Care Note  Patient: Tyler Wise  Procedure(s) Performed: LAPAROSCOPIC RIGHT PARTIAL COLECTOMY WITH A PRIMARY UMBILICAL HERNIA REPAIR (Abdomen)  Patient Location: PACU  Anesthesia Type:General  Level of Consciousness: awake, alert , oriented and patient cooperative  Airway & Oxygen Therapy: Patient Spontanous Breathing and Patient connected to face mask oxygen  Post-op Assessment: Report given to RN, Post -op Vital signs reviewed and stable and Patient moving all extremities  Post vital signs: Reviewed and stable  Last Vitals:  Vitals Value Taken Time  BP 135/80 03/10/21 1413  Temp    Pulse 93 03/10/21 1415  Resp 18 03/10/21 1415  SpO2 100 % 03/10/21 1415  Vitals shown include unvalidated device data.  Last Pain:  Vitals:   03/10/21 1015  PainSc: 0-No pain         Complications: No notable events documented.

## 2021-03-10 NOTE — Anesthesia Postprocedure Evaluation (Signed)
Anesthesia Post Note  Patient: Tyler Wise  Procedure(s) Performed: LAPAROSCOPIC RIGHT PARTIAL COLECTOMY WITH A PRIMARY UMBILICAL HERNIA REPAIR (Abdomen)     Patient location during evaluation: PACU Anesthesia Type: General Level of consciousness: awake and alert Pain management: pain level controlled Vital Signs Assessment: post-procedure vital signs reviewed and stable Respiratory status: spontaneous breathing, nonlabored ventilation, respiratory function stable and patient connected to nasal cannula oxygen Cardiovascular status: blood pressure returned to baseline and stable Postop Assessment: no apparent nausea or vomiting Anesthetic complications: no   No notable events documented.  Last Vitals:  Vitals:   03/10/21 1759 03/10/21 1845  BP: (!) 141/82 125/80  Pulse: 78 87  Resp: 16 18  Temp: 36.6 C 36.4 C  SpO2: 97% 100%    Last Pain:  Vitals:   03/10/21 1845  TempSrc: Oral  PainSc:                  [name]

## 2021-03-10 NOTE — Interval H&P Note (Signed)
History and Physical Interval Note:  03/10/2021 10:46 AM  Tyler Wise  has presented today for surgery, with the diagnosis of colon cancer.  The various methods of treatment have been discussed with the patient and family. After consideration of risks, benefits and other options for treatment, the patient has consented to  Procedure(s): LAPAROSCOPIC PARTIAL COLECTOMY (N/A) as a surgical intervention.  The patient's history has been reviewed, patient examined, no change in status, stable for surgery.  I have reviewed the patient's chart and labs.  Questions were answered to the patient's satisfaction.     Rosario Adie, MD  [facility]

## 2021-03-10 NOTE — Anesthesia Procedure Notes (Signed)
Procedure Name: Intubation Date/Time: 03/10/2021 11:47 AM Performed by: Victoriano Lain, CRNA Pre-anesthesia Checklist: Patient identified, Emergency Drugs available, Suction available, Patient being monitored and Timeout performed Patient Re-evaluated:Patient Re-evaluated prior to induction Oxygen Delivery Method: Circle system utilized Preoxygenation: Pre-oxygenation with 100% oxygen Induction Type: IV induction, Rapid sequence and Cricoid Pressure applied Laryngoscope Size: Mac and 4 Grade View: Grade I Tube type: Oral Tube size: 7.5 mm Number of attempts: 1 Airway Equipment and Method: Stylet Placement Confirmation: ETT inserted through vocal cords under direct vision, positive ETCO2 and breath sounds checked- equal and bilateral Secured at: 21 cm Tube secured with: Tape Dental Injury: Teeth and Oropharynx as per pre-operative assessment

## 2021-03-11 ENCOUNTER — Encounter (HOSPITAL_COMMUNITY): Payer: Self-pay | Admitting: General Surgery

## 2021-03-11 LAB — BASIC METABOLIC PANEL
Anion gap: 6 (ref 5–15)
BUN: 7 mg/dL — ABNORMAL LOW (ref 8–23)
CO2: 24 mmol/L (ref 22–32)
Calcium: 7.9 mg/dL — ABNORMAL LOW (ref 8.9–10.3)
Chloride: 108 mmol/L (ref 98–111)
Creatinine, Ser: 1.1 mg/dL (ref 0.61–1.24)
GFR, Estimated: >60 mL/min (ref >60)
Glucose, Bld: 143 mg/dL — ABNORMAL HIGH (ref 70–99)
Potassium: 3.2 mmol/L — ABNORMAL LOW (ref 3.5–5.1)
Sodium: 138 mmol/L (ref 135–145)

## 2021-03-11 LAB — CBC
HCT: 34.2 % — ABNORMAL LOW (ref 39.0–52.0)
Hemoglobin: 10.7 g/dL — ABNORMAL LOW (ref 13.0–17.0)
MCH: 21.6 pg — ABNORMAL LOW (ref 26.0–34.0)
MCHC: 31.3 g/dL (ref 30.0–36.0)
MCV: 69 fL — ABNORMAL LOW (ref 80.0–100.0)
Platelets: 543 10*3/uL — ABNORMAL HIGH (ref 150–400)
RBC: 4.96 MIL/uL (ref 4.22–5.81)
RDW: 21.4 % — ABNORMAL HIGH (ref 11.5–15.5)
WBC: 18.8 10*3/uL — ABNORMAL HIGH (ref 4.0–10.5)
nRBC: 0 % (ref 0.0–0.2)

## 2021-03-11 NOTE — Progress Notes (Signed)
1 Day Post-Op lap R colectomy and primary hernia repair Subjective: Having some abd pain, no flatus or BM, no nausea  Objective: Vital signs in last 24 hours: Temp:  [97.6 F (36.4 C)-98 F (36.7 C)] 97.9 F (36.6 C) (08/18 0653) Pulse Rate:  [68-103] 68 (08/18 0653) Resp:  [14-20] 20 (08/18 0653) BP: (101-141)/(66-91) 125/74 (08/18 0653) SpO2:  [96 %-100 %] 96 % (08/18 0653) Weight:  [94.1 kg] 94.1 kg (08/17 0959)   Intake/Output from previous day: 08/17 0701 - 08/18 0700 In: 3255.3 [P.O.:720; I.V.:2435.3; IV Piggyback:100] Out: 420 [Urine:370; Blood:50] Intake/Output this shift: No intake/output data recorded.   General appearance: alert and cooperative GI: normal findings: soft, nondistended  Incision: no significant drainage  Lab Results:  Recent Labs    03/08/21 0904 03/11/21 0547  WBC 13.9* 18.8*  HGB 13.1 10.7*  HCT 42.8 34.2*  PLT 611* 543*   BMET Recent Labs    03/08/21 0904 03/11/21 0547  NA 140 138  K 3.5 3.2*  CL 105 108  CO2 26 24  GLUCOSE 138* 143*  BUN 10 7*  CREATININE 1.45* 1.10  CALCIUM 8.4* 7.9*   PT/INR No results for input(s): LABPROT, INR in the last 72 hours. ABG No results for input(s): PHART, HCO3 in the last 72 hours.  Invalid input(s): PCO2, PO2  MEDS, Scheduled  acetaminophen  1,000 mg Oral Q6H   alvimopan  12 mg Oral BID   amLODipine  5 mg Oral Daily   enoxaparin (LOVENOX) injection  40 mg Subcutaneous Q24H   feeding supplement  1 Container Oral TID BM   feeding supplement  237 mL Oral BID BM   gabapentin  300 mg Oral BID   pantoprazole  80 mg Oral Daily   saccharomyces boulardii  250 mg Oral BID   tamsulosin  0.8 mg Oral Daily    Studies/Results: No results found.  Assessment: s/p Procedure(s): LAPAROSCOPIC RIGHT PARTIAL COLECTOMY WITH A PRIMARY UMBILICAL HERNIA REPAIR Patient Active Problem List   Diagnosis Date Noted   Colon cancer, ascending ([place]) 03/10/2021    Expected post op course  Plan: d/c  foley Cont fulls until more bowel function Ambulate in hall SL IVF's   LOS: 1 day     .Rosario Adie, MD [facility], [place]    03/11/2021 8:30 AM

## 2021-03-11 NOTE — Progress Notes (Addendum)
Patient ambulated to the rest room and had a number 6-7 bowel movement with flatulence. Patient complained of upper gastric pain and ginger-ale was given. Multiple burps observed and relief was achieved. Patient refused to ambulate the hall but stated he will start in the morning. Scant blood on honey comb dressing access sight in tac.

## 2021-03-12 ENCOUNTER — Other Ambulatory Visit (HOSPITAL_COMMUNITY): Payer: Self-pay

## 2021-03-12 LAB — CBC
HCT: 30.9 % — ABNORMAL LOW (ref 39.0–52.0)
Hemoglobin: 9.6 g/dL — ABNORMAL LOW (ref 13.0–17.0)
MCH: 21.8 pg — ABNORMAL LOW (ref 26.0–34.0)
MCHC: 31.1 g/dL (ref 30.0–36.0)
MCV: 70.2 fL — ABNORMAL LOW (ref 80.0–100.0)
Platelets: 472 10*3/uL — ABNORMAL HIGH (ref 150–400)
RBC: 4.4 MIL/uL (ref 4.22–5.81)
RDW: 21.2 % — ABNORMAL HIGH (ref 11.5–15.5)
WBC: 18 10*3/uL — ABNORMAL HIGH (ref 4.0–10.5)
nRBC: 0 % (ref 0.0–0.2)

## 2021-03-12 LAB — BASIC METABOLIC PANEL
Anion gap: 5 (ref 5–15)
BUN: 8 mg/dL (ref 8–23)
CO2: 25 mmol/L (ref 22–32)
Calcium: 7.8 mg/dL — ABNORMAL LOW (ref 8.9–10.3)
Chloride: 109 mmol/L (ref 98–111)
Creatinine, Ser: 1.05 mg/dL (ref 0.61–1.24)
GFR, Estimated: >60 mL/min (ref >60)
Glucose, Bld: 100 mg/dL — ABNORMAL HIGH (ref 70–99)
Potassium: 3.2 mmol/L — ABNORMAL LOW (ref 3.5–5.1)
Sodium: 139 mmol/L (ref 135–145)

## 2021-03-12 MED ORDER — TRAMADOL HCL 50 MG PO TABS
50.0000 mg | ORAL_TABLET | Freq: Four times a day (QID) | ORAL | 0 refills | Status: DC | PRN
  Filled 2021-03-12: qty 30, 4d supply, fill #0

## 2021-03-12 MED ORDER — ACETAMINOPHEN 500 MG PO TABS: 1000.0000 mg | ORAL_TABLET | Freq: Four times a day (QID) | ORAL | 0 refills | Status: DC

## 2021-03-12 MED ORDER — TRAMADOL HCL 50 MG PO TABS
50.0000 mg | ORAL_TABLET | Freq: Four times a day (QID) | ORAL | Status: DC | PRN
Start: 2021-03-12 — End: 2021-03-15
  Administered 2021-03-14 – 2021-03-15 (×2): 100 mg via ORAL
  Filled 2021-03-12 (×2): qty 2

## 2021-03-12 NOTE — Discharge Instructions (Addendum)
SURGERY: POST OP INSTRUCTIONS (Surgery for small bowel obstruction, colon resection, etc)   ######################################################################  EAT Gradually transition to a high fiber diet with a fiber supplement over the next few days after discharge  WALK Walk an hour a day.  Control your pain to do that.    CONTROL PAIN Control pain so that you can walk, sleep, tolerate sneezing/coughing, go up/down stairs.  HAVE A BOWEL MOVEMENT DAILY Keep your bowels regular to avoid problems.  OK to try a laxative to override constipation.  OK to use an antidairrheal to slow down diarrhea.  Call if not better after 2 tries  CALL IF YOU HAVE PROBLEMS/CONCERNS Call if you are still struggling despite following these instructions. Call if you have concerns not answered by these instructions  ######################################################################   DIET Follow a light diet the first few days at home.  Start with a bland diet such as soups, liquids, starchy foods, low fat foods, etc.  If you feel full, bloated, or constipated, stay on a ful liquid or pureed/blenderized diet for a few days until you feel better and no longer constipated. Be sure to drink plenty of fluids every day to avoid getting dehydrated (feeling dizzy, not urinating, etc.). Gradually add a fiber supplement to your diet over the next week.  Gradually get back to a regular solid diet.  Avoid fast food or heavy meals the first week as you are more likely to get nauseated. It is expected for your digestive tract to need a few months to get back to normal.  It is common for your bowel movements and stools to be irregular.  You will have occasional bloating and cramping that should eventually fade away.  Until you are eating solid food normally, off all pain medications, and back to regular activities; your bowels will not be normal. Focus on eating a low-fat, high fiber diet the rest of your life  (See Getting to Good Bowel Health, below).  CARE of your INCISION or WOUND  It is good for closed incisions and even open wounds to be washed every day.  Shower every day.  Short baths are fine.  Wash the incisions and wounds clean with soap & water.    You may leave closed incisions open to air if it is dry.   You may cover the incision with clean gauze & replace it after your daily shower for comfort.  STAPLES: You have skin staples.  Leave them in place & set up an appointment for them to be removed by a surgery office nurse ~10 days after surgery. = 1st week of January 2024    ACTIVITIES as tolerated Start light daily activities --- self-care, walking, climbing stairs-- beginning the day after surgery.  Gradually increase activities as tolerated.  Control your pain to be active.  Stop when you are tired.  Ideally, walk several times a day, eventually an hour a day.   Most people are back to most day-to-day activities in a few weeks.  It takes 4-8 weeks to get back to unrestricted, intense activity. If you can walk 30 minutes without difficulty, it is safe to try more intense activity such as jogging, treadmill, bicycling, low-impact aerobics, swimming, etc. Save the most intensive and strenuous activity for last (Usually 4-8 weeks after surgery) such as sit-ups, heavy lifting, contact sports, etc.  Refrain from any intense heavy lifting or straining until you are off narcotics for pain control.  You will have off days, but things should improve   week-by-week. DO NOT PUSH THROUGH PAIN.  Let pain be your guide: If it hurts to do something, don't do it.  Pain is your body warning you to avoid that activity for another week until the pain goes down. You may drive when you are no longer taking narcotic prescription pain medication, you can comfortably wear a seatbelt, and you can safely make sudden turns/stops to protect yourself without hesitating due to pain. You may have sexual intercourse when it  is comfortable. If it hurts to do something, stop.  MEDICATIONS Take your usually prescribed home medications unless otherwise directed.   Blood thinners:  Usually you can restart any strong blood thinners after the second postoperative day.  It is OK to take aspirin right away.     If you are on strong blood thinners (warfarin/Coumadin, Plavix, Xerelto, Eliquis, Pradaxa, etc), discuss with your surgeon, medicine PCP, and/or cardiologist for instructions on when to restart the blood thinner & if blood monitoring is needed (PT/INR blood check, etc).     PAIN CONTROL Pain after surgery or related to activity is often due to strain/injury to muscle, tendon, nerves and/or incisions.  This pain is usually short-term and will improve in a few months.  To help speed the process of healing and to get back to regular activity more quickly, DO THE FOLLOWING THINGS TOGETHER: Increase activity gradually.  DO NOT PUSH THROUGH PAIN Use Ice and/or Heat Try Gentle Massage and/or Stretching Take over the counter pain medication Take Narcotic prescription pain medication for more severe pain  Good pain control = faster recovery.  It is better to take more medicine to be more active than to stay in bed all day to avoid medications.  Increase activity gradually Avoid heavy lifting at first, then increase to lifting as tolerated over the next 6 weeks. Do not "push through" the pain.  Listen to your body and avoid positions and maneuvers than reproduce the pain.  Wait a few days before trying something more intense Walking an hour a day is encouraged to help your body recover faster and more safely.  Start slowly and stop when getting sore.  If you can walk 30 minutes without stopping or pain, you can try more intense activity (running, jogging, aerobics, cycling, swimming, treadmill, sex, sports, weightlifting, etc.) Remember: If it hurts to do it, then don't do it! Use Ice and/or Heat You will have swelling and  bruising around the incisions.  This will take several weeks to resolve. Ice packs or heating pads (6-8 times a day, 30-60 minutes at a time) will help sooth soreness & bruising. Some people prefer to use ice alone, heat alone, or alternate between ice & heat.  Experiment and see what works best for you.  Consider trying ice for the first few days to help decrease swelling and bruising; then, switch to heat to help relax sore spots and speed recovery. Shower every day.  Short baths are fine.  It feels good!  Keep the incisions and wounds clean with soap & water.   Try Gentle Massage and/or Stretching Massage at the area of pain many times a day Stop if you feel pain - do not overdo it Take over the counter pain medication This helps the muscle and nerve tissues become less irritable and calm down faster Choose ONE of the following over-the-counter anti-inflammatory medications: Acetaminophen 500mg tabs (Tylenol) 1-2 pills with every meal and just before bedtime (avoid if you have liver problems or if you have   acetaminophen in you narcotic prescription) Naproxen 220mg tabs (ex. Aleve, Naprosyn) 1-2 pills twice a day (avoid if you have kidney, stomach, IBD, or bleeding problems) Ibuprofen 200mg tabs (ex. Advil, Motrin) 3-4 pills with every meal and just before bedtime (avoid if you have kidney, stomach, IBD, or bleeding problems) Take with food/snack several times a day as directed for at least 2 weeks to help keep pain / soreness down & more manageable. Take Narcotic prescription pain medication for more severe pain A prescription for strong pain control is often given to you upon discharge (for example: oxycodone/Percocet, hydrocodone/Norco/Vicodin, or tramadol/Ultram) Take your pain medication as prescribed. Be mindful that most narcotic prescriptions contain Tylenol (acetaminophen) as well - avoid taking too much Tylenol. If you are having problems/concerns with the prescription medicine (does  not control pain, nausea, vomiting, rash, itching, etc.), please call us [phone] to see if we need to switch you to a different pain medicine that will work better for you and/or control your side effects better. If you need a refill on your pain medication, you must call the office before 4 pm and on weekdays only.  By federal law, prescriptions for narcotics cannot be called into a pharmacy.  They must be filled out on paper & picked up from our office by the patient or authorized caretaker.  Prescriptions cannot be filled after 4 pm nor on weekends.    WHEN TO CALL US [phone] Severe uncontrolled or worsening pain  Fever over 101 F (38.5 C) Concerns with the incision: Worsening pain, redness, rash/hives, swelling, bleeding, or drainage Reactions / problems with new medications (itching, rash, hives, nausea, etc.) Nausea and/or vomiting Difficulty urinating Difficulty breathing Worsening fatigue, dizziness, lightheadedness, blurred vision Other concerns If you are not getting better after two weeks or are noticing you are getting worse, contact our office [phone] for further advice.  We may need to adjust your medications, re-evaluate you in the office, send you to the emergency room, or see what other things we can do to help. The clinic staff is available to answer your questions during regular business hours (8:30am-5pm).  Please don't hesitate to call and ask to speak to one of our nurses for clinical concerns.    A surgeon from [facility] is always on call at the hospitals 24 hours/day If you have a medical emergency, go to the nearest emergency room or call 911.  FOLLOW UP in our office One the day of your discharge from the hospital (or the next business weekday), please call [facility] to set up or confirm an appointment to see your surgeon in the office for a follow-up appointment.  Usually it is 2-3 weeks after your surgery.   If you  have skin staples at your incision(s), let the office know so we can set up a time in the office for the nurse to remove them (usually around 10 days after surgery). Make sure that you call for appointments the day of discharge (or the next business weekday) from the hospital to ensure a convenient appointment time. IF YOU HAVE DISABILITY OR FAMILY LEAVE FORMS, BRING THEM TO THE OFFICE FOR PROCESSING.  DO NOT GIVE THEM TO YOUR DOCTOR.  [facility] [address], [address], [place], [place]  [address] ? [phone] - Main [phone] - Toll Free,  [phone] - Fax [email or web address]    GETTING TO GOOD BOWEL HEALTH. It is expected for your digestive tract to   need a few months to get back to normal.  It is common for your bowel movements and stools to be irregular.  You will have occasional bloating and cramping that should eventually fade away.  Until you are eating solid food normally, off all pain medications, and back to regular activities; your bowels will not be normal.   Avoiding constipation The goal: ONE SOFT BOWEL MOVEMENT A DAY!    Drink plenty of fluids.  Choose water first. TAKE A FIBER SUPPLEMENT EVERY DAY THE REST OF YOUR LIFE During your first week back home, gradually add back a fiber supplement every day Experiment which form you can tolerate.   There are many forms such as powders, tablets, wafers, gummies, etc Psyllium bran (Metamucil), methylcellulose (Citrucel), Miralax or Glycolax, Benefiber, Flax Seed.  Adjust the dose week-by-week (1/2 dose/day to 6 doses a day) until you are moving your bowels 1-2 times a day.  Cut back the dose or try a different fiber product if it is giving you problems such as diarrhea or bloating. Sometimes a laxative is needed to help jump-start bowels if constipated until the fiber supplement can help regulate your bowels.  If you are tolerating eating & you are farting, it is okay to try a gentle  laxative such as double dose MiraLax, prune juice, or Milk of Magnesia.  Avoid using laxatives too often. Stool softeners can sometimes help counteract the constipating effects of narcotic pain medicines.  It can also cause diarrhea, so avoid using for too long. If you are still constipated despite taking fiber daily, eating solids, and a few doses of laxatives, call our office. Controlling diarrhea Try drinking liquids and eating bland foods for a few days to avoid stressing your intestines further. Avoid dairy products (especially milk & ice cream) for a short time.  The intestines often can lose the ability to digest lactose when stressed. Avoid foods that cause gassiness or bloating.  Typical foods include beans and other legumes, cabbage, broccoli, and dairy foods.  Avoid greasy, spicy, fast foods.  Every person has some sensitivity to other foods, so listen to your body and avoid those foods that trigger problems for you. Probiotics (such as active yogurt, Align, etc) may help repopulate the intestines and colon with normal bacteria and calm down a sensitive digestive tract Adding a fiber supplement gradually can help thicken stools by absorbing excess fluid and retrain the intestines to act more normally.  Slowly increase the dose over a few weeks.  Too much fiber too soon can backfire and cause cramping & bloating. It is okay to try and slow down diarrhea with a few doses of antidiarrheal medicines.   Bismuth subsalicylate (ex. Kayopectate, Pepto Bismol) for a few doses can help control diarrhea.  Avoid if pregnant.   Loperamide (Imodium) can slow down diarrhea.  Start with one tablet (2mg) first.  Avoid if you are having fevers or severe pain.  ILEOSTOMY PATIENTS WILL HAVE CHRONIC DIARRHEA since their colon is not in use.    Drink plenty of liquids.  You will need to drink even more glasses of water/liquid a day to avoid getting dehydrated. Record output from your ileostomy.  Expect to empty  the bag every 3-4 hours at first.  Most people with a permanent ileostomy empty their bag 4-6 times at the least.   Use antidiarrheal medicine (especially Imodium) several times a day to avoid getting dehydrated.  Start with a dose at bedtime & breakfast.  Adjust up or   down as needed.  Increase antidiarrheal medications as directed to avoid emptying the bag more than 8 times a day (every 3 hours). Work with your wound ostomy nurse to learn care for your ostomy.  See ostomy care instructions. TROUBLESHOOTING IRREGULAR BOWELS 1) Start with a soft & bland diet. No spicy, greasy, or fried foods.  2) Avoid gluten/wheat or dairy products from diet to see if symptoms improve. 3) Miralax 17gm or flax seed mixed in 8oz. water or juice-daily. May use 2-4 times a day as needed. 4) Gas-X, Phazyme, etc. as needed for gas & bloating.  5) Prilosec (omeprazole) over-the-counter as needed 6)  Consider probiotics (Align, Activa, etc) to help calm the bowels down  Call your doctor if you are getting worse or not getting better.  Sometimes further testing (cultures, endoscopy, X-ray studies, CT scans, bloodwork, etc.) may be needed to help diagnose and treat the cause of the diarrhea. [facility] [address], [address], [place], [place]  [address] [phone] - Main.    [phone]  - Toll Free.   [phone] - Fax [email or web address]   ###############################   #######################################################  Ostomy Support Information  You've heard that people get along just fine with only one of their eyes, or one of their lungs, or one of their kidneys. But you also know that you have only one intestine and only one bladder, and that leaves you feeling awfully empty, both physically and emotionally: You think no other people go around without part of their intestine with the ends of their intestines sticking out through their abdominal walls.    YOU ARE NOT ALONE.  There are nearly three quarters of a million people in the [place] who have an ostomy; people who have had surgery to remove all or part of their colons or bladders.   There is even a national association, the [employer] with over 350 local affiliated support groups that are organized by volunteers who provide peer support and counseling. [employer] has a toll free telephone num-ber, 800-826-0826 and an educational, interactive website, www.ostomy.org   An ostomy is an opening in the belly (abdominal wall) made by surgery. Ostomates are people who have had this procedure. The opening (stoma) allows the kidney or bowel to grdischarge waste. An external pouch covers the stoma to collect waste. Pouches are are a simple bag and are odor free. Different companies have disposable or reusable pouches to fit one's lifestyle. An ostomy can either be temporary or permanent.   THERE ARE THREE MAIN TYPES OF OSTOMIES Colostomy. A colostomy is a surgically created opening in the large intestine (colon). Ileostomy. An ileostomy is a surgically created opening in the small intestine. Urostomy. A urostomy is a surgically created opening to divert urine away from the bladder.  OSTOMY Care  The following guidelines will make care of your colostomy easier. Keep this information close by for quick reference.  Helpful DIET hints Eat a well-balanced diet including vegetables and fresh fruits. Eat on a regular schedule.  Drink at least 6 to 8 glasses of fluids daily. Eat slowly in a relaxed atmosphere. Chew your food thoroughly. Avoid chewing gum, smoking, and drinking from a straw. This will help decrease the amount of air you swallow, which may help reduce gas. Eating yogurt or drinking buttermilk may help reduce gas.  To control gas at night, do not eat after 8 p.m. This will give your bowel time to quiet down before you go   to bed.  If gas is a problem, you can purchase  Beano. Sprinkle Beano on the first bite of food before eating to reduce gas. It has no flavor and should not change the taste of your food. You can buy Beano over the counter at your local drugstore.  Foods like fish, onions, garlic, broccoli, asparagus, and cabbage produce odor. Although your pouch is odor-proof, if you eat these foods you may notice a stronger odor when emptying your pouch. If this is a concern, you may want to limit these foods in your diet.  If you have an ileostomy, you will have chronic diarrhea & need to drink more liquids to avoid getting dehydrated.  Consider antidiarrheal medicine like imodium (loperamide) or Lomotil to help slow down bowel movements / diarrhea into your ileostomy bag.  GETTING TO GOOD BOWEL HEALTH WITH AN ILEOSTOMY    With the colon bypassed & not in use, you will have small bowel diarrhea.   It is important to thicken & slow your bowel movements down.   The goal: 4-6 small BOWEL MOVEMENTS A DAY It is important to drink plenty of liquids to avoid getting dehydrated  CONTROLLING ILEOSTOMY DIARRHEA  TAKE A FIBER SUPPLEMENT (FiberCon or Benefiner soluble fiber) twice a day - to thicken stools by absorbing excess fluid and retrain the intestines to act more normally.  Slowly increase the dose over a few weeks.  Too much fiber too soon can backfire and cause cramping & bloating.  TAKE AN IRON SUPPLEMENT twice a day to naturally constipate your bowels.  Usually ferrous sulfate 325mg twice a day)  TAKE ANTI-DIARRHEAL MEDICINES: Loperamide (Imodium) can slow down diarrhea.  Start with two tablets (= 4mg) first and then try one tablet every 6 hours.  Can go up to 2 pills four times day (8 pills of 2mg max) Avoid if you are having fevers or severe pain.  If you are not better or start feeling worse, stop all medicines and call your doctor for advice LoMotil (Diphenoxylate / Atropine) is another medicine that can constipate & slow down bowel moevements Pepto  Bismol (bismuth) can gently thicken bowels as well  If diarrhea is worse,: drink plenty of liquids and try simpler foods for a few days to avoid stressing your intestines further. Avoid dairy products (especially milk & ice cream) for a short time.  The intestines often can lose the ability to digest lactose when stressed. Avoid foods that cause gassiness or bloating.  Typical foods include beans and other legumes, cabbage, broccoli, and dairy foods.  Every person has some sensitivity to other foods, so listen to our body and avoid those foods that trigger problems for you.Call your doctor if you are getting worse or not better.  Sometimes further testing (cultures, endoscopy, X-ray studies, bloodwork, etc) may be needed to help diagnose and treat the cause of the diarrhea. Take extra anti-diarrheal medicines (maximum is 8 pills of 2mg loperamide a day)   Tips for POUCHING an OSTOMY   Changing Your Pouch The best time to change your pouch is in the morning, before eating or drinking anything. Your stoma can function at any time, but it will function more after eating or drinking.   Applying the pouching system  Place all your equipment close at hand before removing your pouch.  Wash your hands.  Stand or sit in front of a mirror. Use the position that works best for you. Remember that you must keep the skin around the stoma   wrinkle-free for a good seal.  Gently remove the used pouch (1-piece system) or the pouch and old wafer (2-piece system). Empty the pouch into the toilet. Save the closure clip to use again.  Wash the stoma itself and the skin around the stoma. Your stoma may bleed a little when being washed. This is normal. Rinse and pat dry. You may use a wash cloth or soft paper towels (like Bounty), mild soap (like Dial, Safeguard, or Ivory), and water. Avoid soaps that contain perfumes or lotions.  For a new pouch (1-piece system) or a new wafer (2-piece system), measure your  stoma using the stoma guide in each box of supplies.  Trace the shape of your stoma onto the back of the new pouch or the back of the new wafer. Cut out the opening. Remove the paper backing and set it aside.  Optional: Apply a skin barrier powder to surrounding skin if it is irritated (bare or weeping), and dust off the excess. Optional: Apply a skin-prep wipe (such as Skin Prep or All-Kare) to the skin around the stoma, and let it dry. Do not apply this solution if the skin is irritated (red, tender, or broken) or if you have shaved around the stoma. Optional: Apply a skin barrier paste (such as Stomahesive, Coloplast, or Premium) around the opening cut in the back of the pouch or wafer. Allow it to dry for 30 to 60 seconds.  Hold the pouch (1-piece system) or wafer (2-piece system) with the sticky side toward your body. Make sure the skin around the stoma is wrinkle-free. Center the opening on the stoma, then press firmly to your abdomen (Fig. 4). Look in the mirror to check if you are placing the pouch, or wafer, in the right position. For a 2-piece system, snap the pouch onto the wafer. Make sure it snaps into place securely.  Place your hand over the stoma and the pouch or wafer for about 30 seconds. The heat from your hand can help the pouch or wafer stick to your skin.  Add deodorant (such as Super Banish or Nullo) to your pouch. Other options include food extracts such as vanilla oil and peppermint extract. Add about 10 drops of the deodorant to the pouch. Then apply the closure clamp. Note: Do not use toxic  chemicals or commercial cleaning agents in your pouch. These substances may harm the stoma.  Optional: For extra seal, apply tape to all 4 sides around the pouch or wafer, as if you were framing a picture. You may use any brand of medical adhesive tape. Change your pouch every 5 to 7 days. Change it immediately if a leak occurs.  Wash your hands afterwards.  If you are wearing a  2-piece system, you may use 2 new pouches per week and alternate them. Rinse the pouch with mild soap and warm water and hang it to dry for the next day. Apply the fresh pouch. Alternate the 2 pouches like this for a week. After a week, change the wafer and begin with 2 new pouches. Place the old pouches in a plastic bag, and put them in the trash.   LIVING WITH AN OSTOMY  Emptying Your Pouch Empty your pouch when it is one-third full (of urine, stool, and/or gas). If you wait until your pouch is fuller than this, it will be more difficult to empty and more noticeable. When you empty your pouch, either put toilet paper in the toilet bowl first, or flush the   toilet while you empty the pouch. This will reduce splashing. You can empty the pouch between your legs or to one side while sitting, or while standing or stooping. If you have a 2-piece system, you can snap off the pouch to empty it. Remember that your stoma may function during this time. If you wish to rinse your pouch after you empty it, a turkey baster can be helpful. When using a baster, squirt water up into the pouch through the opening at the bottom. With a 2-piece system, you can snap off the pouch to rinse it. After rinsing  your pouch, empty it into the toilet. When rinsing your pouch at home, put a few granules of Dreft soap in the rinse water. This helps lubricate and freshen your pouch. The inside of your pouch can be sprayed with non-stick cooking oil (Pam spray). This may help reduce stool sticking to the inside of the pouch.  Bathing You may shower or bathe with your pouch on or off. Remember that your stoma may function during this time.  The materials you use to wash your stoma and the skin around it should be clean, but they do not need to be sterile.  Wearing Your Pouch During hot weather, or if you perspire a lot in general, wear a cover over your pouch. This may prevent a rash on your skin under the pouch. Pouch covers are  sold at ostomy supply stores. Wear the pouch inside your underwear for better support. Watch your weight. Any gain or loss of 10 to 15 pounds or more can change the way your pouch fits.  Going Away From Home A collapsible cup (like those that come in travel kits) or a soft plastic squirt bottle with a pull-up top (like a travel bottle for shampoo) can be used for rinsing your pouch when you are away from home. Tilt the opening of the pouch at an upward angle when using a cup to rinse.  Carry wet wipes or extra tissues to use in public bathrooms.  Carry an extra pouching system with you at all times.  Never keep ostomy supplies in the glove compartment of your car. Extreme heat or cold can damage the skin barriers and adhesive wafers on the pouch.  When you travel, carry your ostomy supplies with you at all times. Keep them within easy reach. Do not pack ostomy supplies in baggage that will be checked or otherwise separated from you, because your baggage might be lost. If you're traveling out of the country, it is helpful to have a letter stating that you are carrying ostomy supplies as a medical necessity.  If you need ostomy supplies while traveling, look in the yellow pages of the telephone book under "Surgical Supplies." Or call the local ostomy organization to find out where supplies are available.  Do not let your ostomy supplies get low. Always order new pouches before you use the last one.  Reducing Odor Limit foods such as broccoli, cabbage, onions, fish, and garlic in your diet to help reduce odor. Each time you empty your pouch, carefully clean the opening of the pouch, both inside and outside, with toilet paper. Rinse your pouch 1 or 2 times daily after you empty it (see directions for emptying your pouch and going away from home). Add deodorant (such as Super Banish or Nullo) to your pouch. Use air deodorizers in your bathroom. Do not add aspirin to your pouch. Even though  aspirin can help prevent odor, it   could cause ulcers on your stoma.  When to call the doctor Call the doctor if you have any of the following symptoms: Purple, black, or white stoma Severe cramps lasting more than 6 hours Severe watery discharge from the stoma lasting more than 6 hours No output from the colostomy for 3 days Excessive bleeding from your stoma Swelling of your stoma to more than 1/2-inch larger than usual Pulling inward of your stoma below skin level Severe skin irritation or deep ulcers Bulging or other changes in your abdomen  When to call your ostomy nurse Call your ostomy/enterostomal therapy (WOCN) nurse if any of the following occurs: Frequent leaking of your pouching system Change in size or appearance of your stoma, causing discomfort or problems with your pouch Skin rash or rawness Weight gain or loss that causes problems with your pouch     FREQUENTLY ASKED QUESTIONS   Why haven't you met any of these folks who have an ostomy?  Well, maybe you have! You just did not recognize them because an ostomy doesn't show. It can be kept secret if you wish. Why, maybe some of your best friends, office associates or neighbors have an ostomy ... you never can tell. People facing ostomy surgery have many quality-of-life questions like: Will you bulge? Smell? Make noises? Will you feel waste leaving your body? Will you be a captive of the toilet? Will you starve? Be a social outcast? Get/stay married? Have babies? Easily bathe, go swimming, bend over?  OK, let's look at what you can expect:   Will you bulge?  Remember, without part of the intestine or bladder, and its contents, you should have a flatter tummy than before. You can expect to wear, with little exception, what you wore before surgery ... and this in-cludes tight clothing and bathing suits.   Will you smell?  Today, thanks to modern odor proof pouching systems, you can walk into an ostomy support group  meeting and not smell anything that is foul or offensive. And, for those with an ileostomy or colostomy who are concerned about odor when emptying their pouch, there are in-pouch deodorants that can be used to eliminate any waste odors that may exist.   Will you make noises?  Everyone produces gas, especially if they are an air-swallower. But intestinal sounds that occur from time to time are no differ-ent than a gurgling tummy, and quite often your clothing will muffle any sounds.   Will you feel the waste discharges?  For those with a colostomy or ileostomy there might be a slight pressure when waste leaves your body, but understand that the intestines have no nerve endings, so there will be no unpleasant sensations. Those with a urostomy will probably be unaware of any kidney drainage.   Will you be a captive of the toilet?  Immediately post-op you will spend more time in the bathroom than you will after your body recovers from surgery. Every person is different, but on average those with an ileostomy or urostomy may empty their pouches 4 to 6 times a day; a little  less if you have a colostomy. The average wear time between pouch system changes is 3 to 5 days and the changing process should take less than 30 minutes.   Will I need to be on a special diet? Most people return to their normal diet when they have recovered from surgery. Be sure to chew your food well, eat a well-balanced diet and drink plenty of fluids. If   you experience problems with a certain food, wait a couple of weeks and try it again.  Will there be odor and noises? Pouching systems are designed to be odor-proof or odor-resistant. There are deodorants that can be used in the pouch. Medications are also available to help reduce odor. Limit gas-producing foods and carbonated beverages. You will experience less gas and fewer noises as you heal from surgery.  How much time will it take to care for my ostomy? At first, you may  spend a lot of time learning about your ostomy and how to take care of it. As you become more comfortable and skilled at changing the pouching system, it will take very little time to care for it.   Will I be able to return to work? People with ostomies can perform most jobs. As soon as you have healed from surgery, you should be able to return to work. Heavy lifting (more than 10 pounds) may be discouraged.   What about intimacy? Sexual relationships and intimacy are important and fulfilling aspects of your life. They should continue after ostomy surgery. Intimacy-related concerns should be discussed openly between you and your partner.   Can I wear regular clothing? You do not need to wear special clothing. Ostomy pouches are fairly flat and barely noticeable. Elastic undergarments will not hurt the stoma or prevent the ostomy from functioning.   Can I participate in sports? An ostomy should not limit your involvement in sports. Many people with ostomies are runners, skiers, swimmers or participate in other active lifestyles. Talk with your caregiver first before doing heavy physical activity.  Will you starve?  Not if you follow doctor's orders at each stage of your post-op adjustment. There is no such thing as an "ostomy diet". Some people with an ostomy will be able to eat and tolerate anything; others may find diffi-culty with some foods. Each person is an individual and must determine, by trial, what is best for them. A good practice for all is to drink plenty of water.   Will you be a social outcast?  Have you met anyone who has an ostomy and is a social outcast? Why should you be the first? Only your attitude and self image will effect how you are treated. No confi-dent person is an outcast.    PROFESSIONAL HELP   Resources are available if you need help or have questions about your ostomy.   Specially trained nurses called Wound, Ostomy Continence Nurses (WOCN) are available for  consultation in most major medical centers.  Consider getting an ostomy consult at an outpatient ostomy clinic.   [facility] has an Ostomy Clinic run by an WOCN ostomy nurse at the [facility].  [phone]. [facility] can help set up an appointment   The United Ostomy Association (UOA) is a group made up of many local chapters throughout the [place]. These local groups hold meetings and provide support to prospective and existing ostomates. They sponsor educational events and have qualified visitors to make personal or telephone visits. Contact the UOA for the chapter nearest you and for other educational publications.  More detailed information can be found in Colostomy Guide, a publication of the United Ostomy Association (UOA). Contact UOA at 1-800-826-0826 or visit their web site at www.uoaa.org. The website contains links to other sites, suppliers and resources.  [facility] Secure Start Services: Start at the website to enlist for support.  Your Wound Ostomy (WOCN) nurse may have started this   process. https://www.[employer].com/en/securestart Secure Start services are designed to support people as they live their lives with an ostomy or neurogenic bladder. Enrolling is easy and at no cost to the patient. We realize that each person's needs and life journey are different. Through Secure Start services, we want to help people live their life, their way.  #######################################################  

## 2021-03-12 NOTE — Progress Notes (Signed)
2 Days Post-Op lap R colectomy and primary hernia repair Subjective: Having less abd pain, feels oxycodone too strong, having flatus and BMs, no nausea  Objective: Vital signs in last 24 hours: Temp:  [97.5 F (36.4 C)-97.8 F (36.6 C)] 97.5 F (36.4 C) (08/19 [id number]) Pulse Rate:  [62-85] 85 (08/19 0613) Resp:  [16-20] 20 (08/19 0613) BP: (102-154)/(67-83) 127/74 (08/19 0613) SpO2:  [97 %-100 %] 98 % (08/19 [id number]) Weight:  [93.9 kg] 93.9 kg (08/19 0613)   Intake/Output from previous day: 08/18 0701 - 08/19 0700 In: 960 [P.O.:960] Out: 775 [Urine:775] Intake/Output this shift: No intake/output data recorded.   General appearance: alert and cooperative GI: normal findings: soft, nondistended  Incision: no significant drainage  Lab Results:  Recent Labs    03/11/21 0547 03/12/21 0436  WBC 18.8* 18.0*  HGB 10.7* 9.6*  HCT 34.2* 30.9*  PLT 543* 472*    BMET Recent Labs    03/11/21 0547 03/12/21 0436  NA 138 139  K 3.2* 3.2*  CL 108 109  CO2 24 25  GLUCOSE 143* 100*  BUN 7* 8  CREATININE 1.10 1.05  CALCIUM 7.9* 7.8*    PT/INR No results for input(s): LABPROT, INR in the last 72 hours. ABG No results for input(s): PHART, HCO3 in the last 72 hours.  Invalid input(s): PCO2, PO2  MEDS, Scheduled  acetaminophen  1,000 mg Oral Q6H   alvimopan  12 mg Oral BID   amLODipine  5 mg Oral Daily   enoxaparin (LOVENOX) injection  40 mg Subcutaneous Q24H   feeding supplement  1 Container Oral TID BM   feeding supplement  237 mL Oral BID BM   gabapentin  300 mg Oral BID   pantoprazole  80 mg Oral Daily   saccharomyces boulardii  250 mg Oral BID   tamsulosin  0.8 mg Oral Daily    Studies/Results: No results found.  Assessment: s/p Procedure(s): LAPAROSCOPIC RIGHT PARTIAL COLECTOMY WITH A PRIMARY UMBILICAL HERNIA REPAIR Patient Active Problem List   Diagnosis Date Noted   Colon cancer, ascending ([place]) 03/10/2021    Expected post op course  Plan: Advance  to soft diet Ambulate in hall Switch to Tramadol/tylenol for pain control Probable d/c tom am   LOS: 2 days     .Rosario Adie, MD [facility], [place]    03/12/2021 7:33 AM

## 2021-03-12 NOTE — Progress Notes (Signed)
Initial Nutrition Assessment  DOCUMENTATION CODES:   Obesity unspecified  INTERVENTION:   -Will provide handout on low fiber foods per patient request  -Reviewed food options on menu with patient  -D/c Ensure surgery  -Boost Breeze po TID, each supplement provides 250 kcal and 9 grams of protein  -Double portions of protein with each meal  NUTRITION DIAGNOSIS:   Increased nutrient needs related to cancer and cancer related treatments, post-op healing as evidenced by estimated needs.  GOAL:   Patient will meet greater than or equal to 90% of their needs  MONITOR:   PO intake, Supplement acceptance, Labs, Weight trends, I & O's  REASON FOR ASSESSMENT:   Malnutrition Screening Tool    ASSESSMENT:   70 y.o. male with colon cancer.  Patient underwent a CT scan of the abdomen pelvis due to nausea vomiting and diarrhea associated with weight loss.  This showed a mass in the right colon.  He was sent for colonoscopy.  Biopsies show adenocarcinoma.  8/17:s/p partial colectomy with hernia repair  Patient reports feeling very hungry. Diet was advanced to soft this morning. Pt had eaten everything on his breakfast tray this morning and was already planning what he was going to order for lunch. Reviewed low fiber soft diet with pt and reviewed food options with pt. Requests handout on diet and RD placed in discharge instructions. Pt states he doesn't like the Ensure/Boost supplements but drank some while he was on a liquid diet.  Will order double protein portions on meal trays for additional protein.   Pt asking about organ donation during visit but advised him to discuss this with his MD. Also inquiring about exercise, again  advised him to discuss this with MD.  Pt reports losing 50 lbs over the past 2 months. Pt with increased diarrhea during this time.   Medications: Florastor  Labs reviewed: Low K   NUTRITION - FOCUSED PHYSICAL EXAM:  Flowsheet Row Most Recent Value   Orbital Region No depletion  Upper Arm Region Mild depletion  Thoracic and Lumbar Region Unable to assess  Buccal Region No depletion  Temple Region No depletion  Clavicle Bone Region No depletion  Clavicle and Acromion Bone Region No depletion  Scapular Bone Region No depletion  Dorsal Hand No depletion  Patellar Region Unable to assess  Anterior Thigh Region Unable to assess  Posterior Calf Region Unable to assess  Edema (RD Assessment) None       Diet Order:   Diet Order             DIET SOFT Room service appropriate? Yes; Fluid consistency: Thin  Diet effective now                   EDUCATION NEEDS:   Education needs have been addressed  Skin:  Skin Assessment: Skin Integrity Issues: Skin Integrity Issues:: Incisions Incisions: 8/17 abdomen  Last BM:  8/18  Height:   Ht Readings from Last 1 Encounters:  03/10/21 '5\' 9"'$  (1.753 m)    Weight:   Wt Readings from Last 1 Encounters:  03/12/21 93.9 kg    BMI:  Body mass index is 30.57 kg/m.  Estimated Nutritional Needs:   Kcal:  2100-2300  Protein:  110-120g  Fluid:  2.3L/day  Clayton Bibles, MS, RD, LDN Inpatient Clinical Dietitian Contact information available via Amion

## 2021-03-12 NOTE — Progress Notes (Signed)
Patient during the shift had 2 large bloody BM. Primarily blood only in appearance with minimal amounts of visible stool. Patient while on toilet states he feels "weak". Vitals were taken-stable. MD made aware.

## 2021-03-13 LAB — CBC
HCT: 30.6 % — ABNORMAL LOW (ref 39.0–52.0)
Hemoglobin: 9.5 g/dL — ABNORMAL LOW (ref 13.0–17.0)
MCH: 21.5 pg — ABNORMAL LOW (ref 26.0–34.0)
MCHC: 31 g/dL (ref 30.0–36.0)
MCV: 69.2 fL — ABNORMAL LOW (ref 80.0–100.0)
Platelets: 508 10*3/uL — ABNORMAL HIGH (ref 150–400)
RBC: 4.42 MIL/uL (ref 4.22–5.81)
RDW: 21.5 % — ABNORMAL HIGH (ref 11.5–15.5)
WBC: 15.2 10*3/uL — ABNORMAL HIGH (ref 4.0–10.5)
nRBC: 0 % (ref 0.0–0.2)

## 2021-03-13 LAB — BASIC METABOLIC PANEL
Anion gap: 8 (ref 5–15)
BUN: 11 mg/dL (ref 8–23)
CO2: 26 mmol/L (ref 22–32)
Calcium: 8.1 mg/dL — ABNORMAL LOW (ref 8.9–10.3)
Chloride: 108 mmol/L (ref 98–111)
Creatinine, Ser: 1.11 mg/dL (ref 0.61–1.24)
GFR, Estimated: >60 mL/min (ref >60)
Glucose, Bld: 105 mg/dL — ABNORMAL HIGH (ref 70–99)
Potassium: 3 mmol/L — ABNORMAL LOW (ref 3.5–5.1)
Sodium: 142 mmol/L (ref 135–145)

## 2021-03-13 NOTE — Progress Notes (Signed)
3 Days Post-Op   Subjective/Chief Complaint: Complains of not feeling as well today. More sore and tired. Did have some bleeding per rectum last night but this seems to have resolved   Objective: Vital signs in last 24 hours: Temp:  [98 F (36.7 C)-98.8 F (37.1 C)] 98.8 F (37.1 C) (08/20 0601) Pulse Rate:  [79-84] 80 (08/20 0601) Resp:  [14-18] 18 (08/20 0601) BP: (111-136)/(62-78) 123/62 (08/20 0601) SpO2:  [94 %-98 %] 94 % (08/20 0601) Last BM Date: 03/12/21  Intake/Output from previous day: 08/19 0701 - 08/20 0700 In: 960 [P.O.:960] Out: 200 [Urine:200] Intake/Output this shift: No intake/output data recorded.  General appearance: alert and cooperative Resp: clear to auscultation bilaterally Cardio: regular rate and rhythm GI: soft, mild tenderness. Good bs. Incision looks good  Lab Results:  Recent Labs    03/12/21 0436 03/13/21 0558  WBC 18.0* 15.2*  HGB 9.6* 9.5*  HCT 30.9* 30.6*  PLT 472* 508*   BMET Recent Labs    03/12/21 0436 03/13/21 0558  NA 139 142  K 3.2* 3.0*  CL 109 108  CO2 25 26  GLUCOSE 100* 105*  BUN 8 11  CREATININE 1.05 1.11  CALCIUM 7.8* 8.1*   PT/INR No results for input(s): LABPROT, INR in the last 72 hours. ABG No results for input(s): PHART, HCO3 in the last 72 hours.  Invalid input(s): PCO2, PO2  Studies/Results: No results found.  Anti-infectives: Anti-infectives (From admission, onward)    Start     Dose/Rate Route Frequency Ordered Stop   03/10/21 2200  cefoTEtan (CEFOTAN) 2 g in sodium chloride 0.9 % 100 mL IVPB        2 g 200 mL/hr over 30 Minutes Intravenous Every 12 hours 03/10/21 1601 03/10/21 2328   03/10/21 1000  cefoTEtan (CEFOTAN) 2 g in sodium chloride 0.9 % 100 mL IVPB        2 g 200 mL/hr over 30 Minutes Intravenous On call to O.R. 03/10/21 0957 03/10/21 1218       Assessment/Plan: s/p Procedure(s): LAPAROSCOPIC RIGHT PARTIAL COLECTOMY WITH A PRIMARY UMBILICAL HERNIA REPAIR (N/A) Advance  diet POD 3 right colectomy Hg stable at 9.5. no evidence of ongoing bleeding. Will monitor ambulate  LOS: 3 days    Autumn Messing III 03/13/2021

## 2021-03-14 LAB — CBC
HCT: 30.6 % — ABNORMAL LOW (ref 39.0–52.0)
Hemoglobin: 9.5 g/dL — ABNORMAL LOW (ref 13.0–17.0)
MCH: 21.7 pg — ABNORMAL LOW (ref 26.0–34.0)
MCHC: 31 g/dL (ref 30.0–36.0)
MCV: 69.9 fL — ABNORMAL LOW (ref 80.0–100.0)
Platelets: 538 10*3/uL — ABNORMAL HIGH (ref 150–400)
RBC: 4.38 MIL/uL (ref 4.22–5.81)
RDW: 21.4 % — ABNORMAL HIGH (ref 11.5–15.5)
WBC: 14 10*3/uL — ABNORMAL HIGH (ref 4.0–10.5)
nRBC: 0.1 % (ref 0.0–0.2)

## 2021-03-14 MED ORDER — LIP MEDEX EX OINT
TOPICAL_OINTMENT | CUTANEOUS | Status: AC
  Filled 2021-03-14: qty 7

## 2021-03-14 NOTE — Progress Notes (Signed)
4 Days Post-Op   Subjective/Chief Complaint: Feels better than yesterday. Ambulation is improving. Wants to try without walker today   Objective: Vital signs in last 24 hours: Temp:  [98.3 F (36.8 C)-98.5 F (36.9 C)] 98.5 F (36.9 C) (08/21 0525) Pulse Rate:  [87-97] 90 (08/21 0525) Resp:  [16-17] 16 (08/21 0525) BP: (110-139)/(67-83) 110/67 (08/21 0525) SpO2:  [96 %-98 %] 97 % (08/21 0525) Last BM Date: 03/13/21  Intake/Output from previous day: 08/20 0701 - 08/21 0700 In: 1360 [P.O.:1360] Out: 1350 [Urine:1350] Intake/Output this shift: Total I/O In: 0  Out: 200 [Urine:200]  General appearance: alert and cooperative Resp: clear to auscultation bilaterally Cardio: regular rate and rhythm GI: soft, mild tenderness. Good bs. Incision good  Lab Results:  Recent Labs    03/13/21 0558 03/14/21 0403  WBC 15.2* 14.0*  HGB 9.5* 9.5*  HCT 30.6* 30.6*  PLT 508* 538*   BMET Recent Labs    03/12/21 0436 03/13/21 0558  NA 139 142  K 3.2* 3.0*  CL 109 108  CO2 25 26  GLUCOSE 100* 105*  BUN 8 11  CREATININE 1.05 1.11  CALCIUM 7.8* 8.1*   PT/INR No results for input(s): LABPROT, INR in the last 72 hours. ABG No results for input(s): PHART, HCO3 in the last 72 hours.  Invalid input(s): PCO2, PO2  Studies/Results: No results found.  Anti-infectives: Anti-infectives (From admission, onward)    Start     Dose/Rate Route Frequency Ordered Stop   03/10/21 2200  cefoTEtan (CEFOTAN) 2 g in sodium chloride 0.9 % 100 mL IVPB        2 g 200 mL/hr over 30 Minutes Intravenous Every 12 hours 03/10/21 1601 03/10/21 2328   03/10/21 1000  cefoTEtan (CEFOTAN) 2 g in sodium chloride 0.9 % 100 mL IVPB        2 g 200 mL/hr over 30 Minutes Intravenous On call to O.R. 03/10/21 0957 03/10/21 1218       Assessment/Plan: s/p Procedure(s): LAPAROSCOPIC RIGHT PARTIAL COLECTOMY WITH A PRIMARY UMBILICAL HERNIA REPAIR (N/A) Advance diet Pod 4 right colectomy Hg 9.5  stable Ambulate Hopefully home tomorrow  LOS: 4 days    Autumn Messing III 03/14/2021

## 2021-03-15 ENCOUNTER — Other Ambulatory Visit (HOSPITAL_COMMUNITY): Payer: Self-pay

## 2021-03-15 ENCOUNTER — Other Ambulatory Visit: Payer: Self-pay

## 2021-03-15 LAB — CBC
HCT: 27.3 % — ABNORMAL LOW (ref 39.0–52.0)
Hemoglobin: 8.4 g/dL — ABNORMAL LOW (ref 13.0–17.0)
MCH: 21.6 pg — ABNORMAL LOW (ref 26.0–34.0)
MCHC: 30.8 g/dL (ref 30.0–36.0)
MCV: 70.4 fL — ABNORMAL LOW (ref 80.0–100.0)
Platelets: 454 10*3/uL — ABNORMAL HIGH (ref 150–400)
RBC: 3.88 MIL/uL — ABNORMAL LOW (ref 4.22–5.81)
RDW: 21.4 % — ABNORMAL HIGH (ref 11.5–15.5)
WBC: 11.1 10*3/uL — ABNORMAL HIGH (ref 4.0–10.5)
nRBC: 0 % (ref 0.0–0.2)

## 2021-03-15 NOTE — Discharge Summary (Signed)
Physician Discharge Summary  Patient ID: Tyler Wise MRN: [id number] DOB/AGE: [birth year] 71 y.o.  Admit date: 03/10/2021 Discharge date: 03/15/2021  Admission Diagnoses: colon cancer  Discharge Diagnoses:  Active Problems:   Colon cancer, ascending [facility])   Discharged Condition: good  Hospital Course: Patient was admitted to the hospital after laparoscopic assisted right colectomy and umbilical hernia repair.  His diet was advanced as tolerated.  He did have a episode of anastomotic bleeding that resolved quickly.  Hemoglobin has remained stable.  Patient's pain control with tramadol and he is tolerating a diet and having good bowel function.  He was felt to be in stable condition for discharged home.  Consults: None  Significant Diagnostic Studies: labs: cbc, bmet  Treatments: IV hydration, analgesia: Tramadol, and surgery: See above  Discharge Exam: Blood pressure 133/77, pulse 88, temperature 98.7 F (37.1 C), temperature source Oral, resp. rate 16, height '5\' 9"'$  (1.753 m), weight 97.1 kg, SpO2 90 %. General appearance: alert and cooperative GI: normal findings: soft, non-tender Incision/Wound: clean, dry, intact  Disposition: home   Allergies as of 03/15/2021   No Known Allergies      Medication List     TAKE these medications    acetaminophen 500 MG tablet Commonly known as: TYLENOL Take 2 tablets (1,000 mg total) by mouth every 6 (six) hours.   amLODipine 5 MG tablet Commonly known as: NORVASC Take 5 mg by mouth daily.   aspirin EC 81 MG tablet Take 81 mg by mouth daily. Swallow whole.   atorvastatin 10 MG tablet Commonly known as: LIPITOR Take 10 mg by mouth daily.   bisacodyl 5 MG EC tablet Commonly known as: DULCOLAX Take 5 mg by mouth daily as needed for moderate constipation.   omeprazole 40 MG capsule Commonly known as: PRILOSEC Take 40 mg by mouth daily.   ondansetron 4 MG tablet Commonly known as: ZOFRAN Take 4 mg by mouth every 8  (eight) hours as needed for nausea or vomiting.   tamsulosin 0.4 MG Caps capsule Commonly known as: FLOMAX Take 0.8 mg by mouth daily.   traMADol 50 MG tablet Commonly known as: ULTRAM Take 1 - 2 tablets by mouth every 6 hours as needed for moderate or severe pain.               Durable Medical Equipment  (From admission, onward)           Start     Ordered   03/12/21 0745  For home use only DME Walker rolling  Once       Comments: As needed for balance to help transfer and ambulate  Question Answer Comment  Walker: With [place]   Patient needs a walker to treat with the following condition Impaired ambulation      03/12/21 0744            Follow-up Information     Leighton Ruff, MD. Schedule an appointment as soon as possible for a visit in 2 week(s).   Specialties: General Surgery, Colon and Rectal Surgery Contact information: [place] [address] [phone]                 Signed: Rosario Adie [phone], 8:25 AM

## 2021-03-15 NOTE — TOC Transition Note (Signed)
Transition of Care [facility]) - CM/SW Discharge Note  Patient Details  Name: Tyler Wise MRN: [id number] Date of Birth: 1951/04/13  [facility] CM/SW Contact:  Sherie Don, LCSW Phone Number: 03/15/2021, 10:25 AM  Clinical Narrative: DME order placed for a rolling walker. Patient is agreeable to DME referral to [employer]. CSW made referral to [facility] with [employer]. [employer] to deliver rolling walker to patient's room. TOC signing off.  Final next level of care: Home/Self Care Barriers to Discharge: Barriers Resolved  Patient Goals and CMS Choice Patient states their goals for this hospitalization and ongoing recovery are:: Return home CMS Medicare.gov Compare Post Acute Care list provided to:: Patient Choice offered to / list presented to : Patient  Discharge Plan and Services         DME Arranged: Walker rolling DME Agency: [employer] Date DME Agency Contacted: 03/15/21 Time DME Agency Contacted: [phone] Representative spoke with at DME Agency: Freda Munro  Readmission Risk Interventions No flowsheet data found.

## 2021-03-16 LAB — SURGICAL PATHOLOGY

## 2021-03-17 ENCOUNTER — Other Ambulatory Visit (HOSPITAL_COMMUNITY): Payer: Self-pay

## 2021-03-19 ENCOUNTER — Other Ambulatory Visit (HOSPITAL_COMMUNITY): Payer: Self-pay

## 2021-03-31 ENCOUNTER — Other Ambulatory Visit: Payer: Self-pay

## 2021-03-31 NOTE — Progress Notes (Signed)
The proposed treatment discussed in conference is for discussion purpose only and is not a binding recommendation.  The patients have not been physically examined, or presented with their treatment options.  Therefore, final treatment plans cannot be decided.  

## 2021-04-14 ENCOUNTER — Other Ambulatory Visit: Payer: Self-pay

## 2021-04-14 ENCOUNTER — Ambulatory Visit (INDEPENDENT_AMBULATORY_CARE_PROVIDER_SITE_OTHER): Payer: Medicare HMO | Admitting: Podiatry

## 2021-04-14 DIAGNOSIS — M79674 Pain in right toe(s): Secondary | ICD-10-CM | POA: Diagnosis not present

## 2021-04-14 DIAGNOSIS — L853 Xerosis cutis: Secondary | ICD-10-CM | POA: Diagnosis not present

## 2021-04-14 DIAGNOSIS — M79675 Pain in left toe(s): Secondary | ICD-10-CM | POA: Diagnosis not present

## 2021-04-14 DIAGNOSIS — B351 Tinea unguium: Secondary | ICD-10-CM | POA: Diagnosis not present

## 2021-04-14 MED ORDER — AMMONIUM LACTATE 12 % EX LOTN: 1.0000 "application " | TOPICAL_LOTION | CUTANEOUS | 0 refills | Status: DC | PRN

## 2021-04-14 NOTE — Progress Notes (Signed)
  Subjective:  Patient ID: Tyler Wise, male    DOB: 12-17-1950,  MRN: [id number]  Chief Complaint  Patient presents with   Callouses    Dry peeling skin  Nail fungus     70 y.o. male returns for the above complaint.  Patient presents with thickened elongated dystrophic toenails x10.  Mild pain on palpation.  Patient states that he is not a great degree himself.  He would like for me to do it.  He also secondary complaint of dry skin to both lower extremity.  No subjective component of itchingnoted.  He states been present for a while.  He is failed all the over-the-counter medication.  He would like to discuss prescription options.  Objective:  There were no vitals filed for this visit. Podiatric Exam: Vascular: dorsalis pedis and posterior tibial pulses are palpable bilateral. Capillary return is immediate. Temperature gradient is WNL. Skin turgor WNL  Sensorium: Normal Semmes Weinstein monofilament test. Normal tactile sensation bilaterally. Nail Exam: Pt has thick disfigured discolored nails with subungual debris noted bilateral entire nail hallux through fifth toenails.  Pain on palpation to the nails. Ulcer Exam: There is no evidence of ulcer or pre-ulcerative changes or infection. Orthopedic Exam: Muscle tone and strength are WNL. No limitations in general ROM. No crepitus or effusions noted.  Skin: No Porokeratosis. No infection or ulcers.  Xerosis/dry skin noted to both lower extremity no signs of fissures noted.  No ulceration noted.    Assessment & Plan:   1. Xerosis cutis   2. Pain due to onychomycosis of toenails of both feet     Patient was evaluated and treated and all questions answered.  Xerosis bilaterally -I explained to the patient the etiology of xerosis and various treatment options were extensively discussed.  I explained to the patient the importance of maintaining moisturization of the skin with application of over-the-counter lotion such as Eucerin or  Luciderm.  I have asked the patient to apply this twice a day.  If unable to resolve patient will benefit from prescription lotion.   Onychomycosis with pain  -Nails palliatively debrided as below. -Educated on self-care  Procedure: Nail Debridement Rationale: pain  Type of Debridement: manual, sharp debridement. Instrumentation: Nail nipper, rotary burr. Number of Nails: 10  Procedures and Treatment: Consent by patient was obtained for treatment procedures. The patient understood the discussion of treatment and procedures well. All questions were answered thoroughly reviewed. Debridement of mycotic and hypertrophic toenails, 1 through 5 bilateral and clearing of subungual debris. No ulceration, no infection noted.  Return Visit-Office Procedure: Patient instructed to return to the office for a follow up visit 3 months for continued evaluation and treatment.  Boneta Lucks, DPM    No follow-ups on file.

## 2021-05-07 ENCOUNTER — Other Ambulatory Visit: Payer: Self-pay

## 2021-05-07 ENCOUNTER — Encounter: Payer: Self-pay | Admitting: Internal Medicine

## 2021-05-07 ENCOUNTER — Ambulatory Visit: Payer: Medicare HMO | Attending: Internal Medicine | Admitting: Internal Medicine

## 2021-05-07 VITALS — BP 130/82 | HR 74 | Resp 16 | Ht 69.0 in | Wt 221.8 lb

## 2021-05-07 DIAGNOSIS — D500 Iron deficiency anemia secondary to blood loss (chronic): Secondary | ICD-10-CM | POA: Diagnosis not present

## 2021-05-07 DIAGNOSIS — I10 Essential (primary) hypertension: Secondary | ICD-10-CM

## 2021-05-07 DIAGNOSIS — E66811 Obesity, class 1: Secondary | ICD-10-CM | POA: Insufficient documentation

## 2021-05-07 DIAGNOSIS — R7303 Prediabetes: Secondary | ICD-10-CM

## 2021-05-07 DIAGNOSIS — Z23 Encounter for immunization: Secondary | ICD-10-CM | POA: Diagnosis not present

## 2021-05-07 DIAGNOSIS — E785 Hyperlipidemia, unspecified: Secondary | ICD-10-CM | POA: Diagnosis not present

## 2021-05-07 DIAGNOSIS — Z7689 Persons encountering health services in other specified circumstances: Secondary | ICD-10-CM

## 2021-05-07 DIAGNOSIS — Z6833 Body mass index (BMI) 33.0-33.9, adult: Secondary | ICD-10-CM

## 2021-05-07 DIAGNOSIS — Z85038 Personal history of other malignant neoplasm of large intestine: Secondary | ICD-10-CM | POA: Insufficient documentation

## 2021-05-07 DIAGNOSIS — E669 Obesity, unspecified: Secondary | ICD-10-CM

## 2021-05-07 NOTE — Patient Instructions (Signed)

## 2021-05-07 NOTE — Progress Notes (Signed)
Patient ID: Tyler Wise, male    DOB: Mar 10, 1951  MRN: [id number]  CC: New Patient (Initial Visit)   Subjective: Tyler Wise is a 70 y.o. male who presents for new patient visit. His concerns today include:  Patient with history of HTN: HL, prediabetes, former tob user, history of colon cancer of the ascending colon (s/p partial colectomy 02/2021  Previous PCP was NP Arthur Holms at [facility].  Pt with recent dx of ascending colon CA and is status post partial colectomy with umbilical hernia repair done by Dr. Sandrea Matte 02/2021.  Patient reports no chemo or radiation planned.  He states that he will be having a repeat procedure which she thinks is a colonoscopy in February of next year.  Overall he feels he is healed well from his surgery.  Noted to have anemia with H/H of 8.4/27.3 and MCV of 70 at the time of hospital discharge in August.  He tells me he has history of iron deficiency anemia and was on iron supplement in the past but was not placed on it after his surgery.  He denies any dizziness or fatigue at this time.  HYPERTENSION Currently taking: see medication list.  Currently on amlodipine 5 mg daily. Med Adherence: [x]  Yes    []  No Medication side effects: []  Yes    [x]  No Adherence with salt restriction: [x]  Yes    []  No Home Monitoring?: []  Yes    [x]  No Monitoring Frequency:  Home BP results range:  SOB? []  Yes    [x]  No Chest Pain?: []  Yes    [x]  No Leg swelling?: []  Yes    [x]  No Headaches?: []  Yes    [x]  No Dizziness? []  Yes    [x]  No Comments:   HL:  taking and tolerating tolerating Lipitor  Obesity/PreDM: dx with A1C 6.3 02/2021.  Doing better with eating habits but reports that some days he just overeats.  He tells me that he loss 62 lbs from May-August 2022 due to colon CA. Has regained 7 lbs since surgery.  Not very active.  HM: Due for flu shot.  Reports having had Shingrix series and pneumonia vaccine when he lived in [place].  Due for  Tdap.  Patient Active Problem List   Diagnosis Date Noted   Colon cancer, ascending ([place]) 03/10/2021     Current Outpatient Medications on File Prior to Visit  Medication Sig Dispense Refill   acetaminophen (TYLENOL) 500 MG tablet Take 2 tablets (1,000 mg total) by mouth every 6 (six) hours. 30 tablet 0   amLODipine (NORVASC) 5 MG tablet Take 5 mg by mouth daily.     ammonium lactate (AMLACTIN) 12 % lotion Apply 1 application topically as needed for dry skin. 400 g 0   aspirin EC 81 MG tablet Take 81 mg by mouth daily. Swallow whole.     atorvastatin (LIPITOR) 10 MG tablet Take 10 mg by mouth daily.     tamsulosin (FLOMAX) 0.4 MG CAPS capsule Take 0.8 mg by mouth daily.     No current facility-administered medications on file prior to visit.    No Known Allergies  Social History   Socioeconomic History   Marital status: Single    Spouse name: Not on file   Number of children: Not on file   Years of education: Not on file   Highest education level: Not on file  Occupational History   Not on file  Tobacco Use   Smoking status:  Former    Packs/day: 1.50    Years: 3.00    Pack years: 4.50    Types: Cigarettes    Quit date: 12/24/1974    Years since quitting: 46.4   Smokeless tobacco: Never  Vaping Use   Vaping Use: Never used  Substance and Sexual Activity   Alcohol use: Not Currently   Drug use: Never   Sexual activity: Not on file  Other Topics Concern   Not on file  Social History Narrative   Not on file   Social Determinants of Health   Financial Resource Strain: Not on file  Food Insecurity: Not on file  Transportation Needs: Not on file  Physical Activity: Not on file  Stress: Not on file  Social Connections: Not on file  Intimate Partner Violence: Not on file    No family history on file.  Past Surgical History:  Procedure Laterality Date   LAPAROSCOPIC PARTIAL COLECTOMY N/A 03/10/2021   Procedure: LAPAROSCOPIC RIGHT PARTIAL COLECTOMY WITH A PRIMARY  UMBILICAL HERNIA REPAIR;  Surgeon: Leighton Ruff, MD;  Location: [facility];  Service: General;  Laterality: N/A;    ROS: Review of Systems Negative except as stated above  PHYSICAL EXAM: BP 130/82   Pulse 74   Resp 16   Ht 5\' 9"  (1.753 m)   Wt 221 lb 12.8 oz (100.6 kg)   SpO2 98%   BMI 32.75 kg/m   Wt Readings from Last 3 Encounters:  05/07/21 221 lb 12.8 oz (100.6 kg)  03/15/21 214 lb 1.1 oz (97.1 kg)    Physical Exam  General appearance - alert, well appearing, elderly African-American male who looks younger than stated age and in no distress Mental status - normal mood, behavior, speech, dress, motor activity, and thought processes Eyes - pupils equal and reactive, extraocular eye movements intact.  Mildly pale conjunctiva Nose - normal and patent, no erythema, discharge or polyps Mouth - mucous membranes moist, pharynx normal without lesions Neck - supple, no significant adenopathy Chest - clear to auscultation, no wheezes, rales or rhonchi, symmetric air entry Heart - normal rate, regular rhythm, normal S1, S2, no murmurs, rubs, clicks or gallops Abdomen -normal bowel sounds.  He has a healed midline scar below umbilicus. Extremities -no lower extremity edema.  CMP Latest Ref Rng & Units 03/13/2021 03/12/2021 03/11/2021  Glucose 70 - 99 mg/dL 105(H) 100(H) 143(H)  BUN 8 - 23 mg/dL 11 8 7(L)  Creatinine 0.61 - 1.24 mg/dL 1.11 1.05 1.10  Sodium 135 - 145 mmol/L 142 139 138  Potassium 3.5 - 5.1 mmol/L 3.0(L) 3.2(L) 3.2(L)  Chloride 98 - 111 mmol/L 108 109 108  CO2 22 - 32 mmol/L 26 25 24   Calcium 8.9 - 10.3 mg/dL 8.1(L) 7.8(L) 7.9(L)   Lipid Panel  No results found for: CHOL, TRIG, HDL, CHOLHDL, VLDL, LDLCALC, LDLDIRECT  CBC    Component Value Date/Time   WBC 11.1 (H) 03/15/2021 0408   RBC 3.88 (L) 03/15/2021 0408   HGB 8.4 (L) 03/15/2021 0408   HCT 27.3 (L) 03/15/2021 0408   PLT 454 (H) 03/15/2021 0408   MCV 70.4 (L) 03/15/2021 0408   MCH 21.6 (L) 03/15/2021 0408    MCHC 30.8 03/15/2021 0408   RDW 21.4 (H) 03/15/2021 0408    ASSESSMENT AND PLAN:  1. Encounter to establish care I requested patient's sign a release for [place] to get his records from [employer] [facility] and from [facility].  2. Essential hypertension Close to goal.  Continue amlodipine 5  mg daily.  DASH diet discussed and encouraged. - CBC - Comprehensive metabolic panel  3. Hyperlipidemia, unspecified hyperlipidemia type Continue atorvastatin. - Lipid panel  4. Obesity (BMI 30.0-34.9) 5. Prediabetes -Discussed and encourage healthy eating habits.  Advised patient that he is obese based on his BMI.  Advised against trying to regain 60 pounds that he had loss unintentionally due to his cancer diagnosis.  Advised to eliminate sugary drinks from the diet, cut back on portion sizes of white carbohydrates, eat more lean white meat instead of beef or pork and incorporate fresh fruits and vegetables into the diet daily.  He declines referral to nutritionist. -Encouraged him to try to get in some form of moderate intensity exercise several days a week for about 30 minutes.  Advised him to start low and go slow.  If he can only do it twice a week for 15 minutes he should stop there.  6. Iron deficiency anemia due to chronic blood loss Plan to recheck CBC today to see whether his blood count has improved since surgery.  7. History of colon cancer Plans to follow-up with surgeon the early part of next year.  8. Need for immunization against influenza - Flu Vaccine QUAD 64mo+IM (Fluarix, Fluzone & Alfiuria Quad PF)  9. Need for Tdap vaccination Given today.  10. Need for COVID-19 vaccine Advised patient to get his COVID booster within the next few weeks.   Patient was given the opportunity to ask questions.  Patient verbalized understanding of the plan and was able to repeat key elements of the plan.   Orders Placed This Encounter  Procedures   Flu Vaccine QUAD 83mo+IM (Fluarix, Fluzone &  Alfiuria Quad PF)   Tdap vaccine greater than or equal to 7yo IM   CBC   Comprehensive metabolic panel   Lipid panel     Requested Prescriptions    No prescriptions requested or ordered in this encounter    Return in about 4 months (around 09/07/2021) for Sign release for me to get records from [facility] and [place].  Karle Plumber, MD, FACP

## 2021-05-08 ENCOUNTER — Other Ambulatory Visit: Payer: Self-pay | Admitting: Internal Medicine

## 2021-05-08 LAB — CBC
Hematocrit: 37.1 % — ABNORMAL LOW (ref 37.5–51.0)
Hemoglobin: 10.6 g/dL — ABNORMAL LOW (ref 13.0–17.7)
MCH: 20.5 pg — ABNORMAL LOW (ref 26.6–33.0)
MCHC: 28.6 g/dL — ABNORMAL LOW (ref 31.5–35.7)
MCV: 72 fL — ABNORMAL LOW (ref 79–97)
Platelets: 472 10*3/uL — ABNORMAL HIGH (ref 150–450)
RBC: 5.17 x10E6/uL (ref 4.14–5.80)
RDW: 17.5 % — ABNORMAL HIGH (ref 11.6–15.4)
WBC: 8 10*3/uL (ref 3.4–10.8)

## 2021-05-08 LAB — LIPID PANEL
Chol/HDL Ratio: 2.8 ratio (ref 0.0–5.0)
Cholesterol, Total: 164 mg/dL (ref 100–199)
HDL: 59 mg/dL (ref >39)
LDL Chol Calc (NIH): 90 mg/dL (ref 0–99)
Triglycerides: 79 mg/dL (ref 0–149)
VLDL Cholesterol Cal: 15 mg/dL (ref 5–40)

## 2021-05-08 LAB — COMPREHENSIVE METABOLIC PANEL
ALT: 18 IU/L (ref 0–44)
AST: 22 IU/L (ref 0–40)
Albumin/Globulin Ratio: 1.5 (ref 1.2–2.2)
Albumin: 4.1 g/dL (ref 3.7–4.7)
Alkaline Phosphatase: 116 IU/L (ref 44–121)
BUN/Creatinine Ratio: 13 (ref 10–24)
BUN: 15 mg/dL (ref 8–27)
Bilirubin Total: 0.4 mg/dL (ref 0.0–1.2)
CO2: 21 mmol/L (ref 20–29)
Calcium: 8.8 mg/dL (ref 8.6–10.2)
Chloride: 105 mmol/L (ref 96–106)
Creatinine, Ser: 1.13 mg/dL (ref 0.76–1.27)
Globulin, Total: 2.7 g/dL (ref 1.5–4.5)
Glucose: 92 mg/dL (ref 70–99)
Potassium: 4.2 mmol/L (ref 3.5–5.2)
Sodium: 141 mmol/L (ref 134–144)
Total Protein: 6.8 g/dL (ref 6.0–8.5)
eGFR: 69 mL/min/{1.73_m2} (ref >59)

## 2021-05-08 MED ORDER — FERROUS SULFATE 325 (65 FE) MG PO TABS: 325.0000 mg | ORAL_TABLET | Freq: Every day | ORAL | 1 refills | Status: DC

## 2021-05-08 NOTE — Progress Notes (Signed)
Let patient know that he still has a moderate anemia that has improved from when he was discharged from the hospital.  I recommend taking an iron supplement daily.  Prescription sent to his pharmacy.  Kidney and liver function tests are okay.  Cholesterol levels are normal.

## 2021-05-11 ENCOUNTER — Telehealth: Payer: Self-pay

## 2021-05-11 ENCOUNTER — Other Ambulatory Visit: Payer: Self-pay

## 2021-05-11 MED ORDER — FERROUS SULFATE 325 (65 FE) MG PO TABS: 325.0000 mg | ORAL_TABLET | Freq: Every day | ORAL | 1 refills | Status: DC

## 2021-05-11 NOTE — Telephone Encounter (Signed)
Contacted pt to go over lab results pt is aware and doesn't have any questions or concerns 

## 2021-07-06 ENCOUNTER — Other Ambulatory Visit: Payer: Self-pay | Admitting: Internal Medicine

## 2021-07-06 NOTE — Telephone Encounter (Signed)
Copied from [place]. Topic: Quick Communication - Rx Refill/Question >> Jul 06, 2021 12:01 PM Lenon Curt, Rana Snare A wrote: Medication: tamsulosin (FLOMAX) 0.4 MG CAPS capsule [[id number]]   Has the patient contacted their pharmacy? Yes.  The patient's pharmacy has reached out on their behalf (Agent: If no, request that the patient contact the pharmacy for the refill. If patient does not wish to contact the pharmacy document the reason why and proceed with request.) (Agent: If yes, when and what did the pharmacy advise?)  Preferred Pharmacy (with phone number or street name): [place], [place] [address] Phone: [phone] Fax: [phone]   Has the patient been seen for an appointment in the last year OR does the patient have an upcoming appointment? Yes.    Agent: Please be advised that RX refills may take up to 3 business days. We ask that you follow-up with your pharmacy.

## 2021-07-06 NOTE — Telephone Encounter (Signed)
Requested medication (s) are due for refill today: yes  Requested medication (s) are on the active medication list: yes  Last refill:  unsure  Future visit scheduled: yes  Notes to clinic:  historical med. Please advise     Requested Prescriptions  Pending Prescriptions Disp Refills   tamsulosin (FLOMAX) 0.4 MG CAPS capsule 30 capsule     Sig: Take 2 capsules (0.8 mg total) by mouth daily.     Urology: Alpha-Adrenergic Blocker Passed - 07/06/2021 12:57 PM      Passed - Last BP in normal range    BP Readings from Last 1 Encounters:  05/07/21 130/82          Passed - Valid encounter within last 12 months    Recent Outpatient Visits           2 months ago Encounter to establish care   [place], MD       Future Appointments             In 2 months Wynetta Emery Dalbert Batman, MD [place]

## 2021-07-07 ENCOUNTER — Other Ambulatory Visit: Payer: Self-pay | Admitting: Internal Medicine

## 2021-07-07 MED ORDER — TAMSULOSIN HCL 0.4 MG PO CAPS: 0.8000 mg | ORAL_CAPSULE | Freq: Every day | ORAL | 3 refills | Status: DC

## 2021-07-07 NOTE — Telephone Encounter (Signed)
Requested medication (s) are due for refill today - yes  Requested medication (s) are on the active medication list -yes  Future visit scheduled -yes  Last refill: Amlodipine- unsure- listed historical                 Tamsuosin- filled by office today  Notes to clinic: Request RF: medication listed as historical  Requested Prescriptions  Pending Prescriptions Disp Refills   amLODipine (NORVASC) 5 MG tablet      Sig: Take 1 tablet (5 mg total) by mouth daily.     Cardiovascular:  Calcium Channel Blockers Passed - 07/07/2021 10:47 AM      Passed - Last BP in normal range    BP Readings from Last 1 Encounters:  05/07/21 130/82          Passed - Valid encounter within last 6 months    Recent Outpatient Visits           2 months ago Encounter to establish care   [place], MD       Future Appointments             In 2 months Ladell Pier, MD [place]             tamsulosin (FLOMAX) 0.4 MG CAPS capsule 30 capsule     Sig: Take 2 capsules (0.8 mg total) by mouth daily.     Urology: Alpha-Adrenergic Blocker Passed - 07/07/2021 10:47 AM      Passed - Last BP in normal range    BP Readings from Last 1 Encounters:  05/07/21 130/82          Passed - Valid encounter within last 12 months    Recent Outpatient Visits           2 months ago Encounter to establish care   [place], MD       Future Appointments             In 2 months Ladell Pier, MD [place]               Requested Prescriptions  Pending Prescriptions Disp Refills   amLODipine (NORVASC) 5 MG tablet      Sig: Take 1 tablet (5 mg total) by mouth daily.     Cardiovascular:  Calcium Channel Blockers Passed - 07/07/2021 10:47 AM      Passed - Last BP in normal range    BP Readings from Last 1 Encounters:   05/07/21 130/82          Passed - Valid encounter within last 6 months    Recent Outpatient Visits           2 months ago Encounter to establish care   [place], MD       Future Appointments             In 2 months Ladell Pier, MD [place]             tamsulosin (FLOMAX) 0.4 MG CAPS capsule 30 capsule     Sig: Take 2 capsules (0.8 mg total) by mouth daily.     Urology: Alpha-Adrenergic Blocker Passed - 07/07/2021 10:47 AM      Passed - Last BP in normal range  BP Readings from Last 1 Encounters:  05/07/21 130/82          Passed - Valid encounter within last 12 months    Recent Outpatient Visits           2 months ago Encounter to establish care   [place], MD       Future Appointments             In 2 months Wynetta Emery Dalbert Batman, MD [place]

## 2021-07-07 NOTE — Telephone Encounter (Signed)
Medication Refill - Medication: amLODipine (NORVASC) 5 MG tablet   Also says he is out of Tamsulosin and needs a rush on both Rxs.    Has the patient contacted their pharmacy? Yes.   (Agent: If no, request that the patient contact the pharmacy for the refill. If patient does not wish to contact the pharmacy document the reason why and proceed with request.) (Agent: If yes, when and what did the pharmacy advise?)  Preferred Pharmacy (with phone number or street name):  [place], [place]  [place] [address]  Phone: [phone] Fax: [phone]   Has the patient been seen for an appointment in the last year OR does the patient have an upcoming appointment? Yes.    Agent: Please be advised that RX refills may take up to 3 business days. We ask that you follow-up with your pharmacy.

## 2021-07-08 MED ORDER — TAMSULOSIN HCL 0.4 MG PO CAPS: 0.8000 mg | ORAL_CAPSULE | Freq: Every day | ORAL | 2 refills | Status: DC

## 2021-07-08 MED ORDER — AMLODIPINE BESYLATE 5 MG PO TABS: 5.0000 mg | ORAL_TABLET | Freq: Every day | ORAL | 2 refills | Status: DC

## 2021-07-26 DIAGNOSIS — R6889 Other general symptoms and signs: Secondary | ICD-10-CM | POA: Diagnosis not present

## 2021-08-16 DIAGNOSIS — R6889 Other general symptoms and signs: Secondary | ICD-10-CM | POA: Diagnosis not present

## 2021-08-30 DIAGNOSIS — R6889 Other general symptoms and signs: Secondary | ICD-10-CM | POA: Diagnosis not present

## 2021-09-07 ENCOUNTER — Ambulatory Visit: Payer: Medicare HMO | Admitting: Internal Medicine

## 2021-09-13 DIAGNOSIS — Z0000 Encounter for general adult medical examination without abnormal findings: Secondary | ICD-10-CM | POA: Diagnosis not present

## 2021-09-13 DIAGNOSIS — R6889 Other general symptoms and signs: Secondary | ICD-10-CM | POA: Diagnosis not present

## 2021-09-29 ENCOUNTER — Encounter: Payer: Self-pay | Admitting: Physician Assistant

## 2021-09-29 ENCOUNTER — Other Ambulatory Visit: Payer: Self-pay

## 2021-09-29 ENCOUNTER — Ambulatory Visit: Payer: Medicare HMO | Attending: Internal Medicine | Admitting: Physician Assistant

## 2021-09-29 VITALS — BP 142/95 | HR 91 | Wt 259.0 lb

## 2021-09-29 DIAGNOSIS — R7303 Prediabetes: Secondary | ICD-10-CM

## 2021-09-29 DIAGNOSIS — T7840XA Allergy, unspecified, initial encounter: Secondary | ICD-10-CM

## 2021-09-29 DIAGNOSIS — D500 Iron deficiency anemia secondary to blood loss (chronic): Secondary | ICD-10-CM | POA: Diagnosis not present

## 2021-09-29 DIAGNOSIS — J302 Other seasonal allergic rhinitis: Secondary | ICD-10-CM

## 2021-09-29 DIAGNOSIS — M65311 Trigger thumb, right thumb: Secondary | ICD-10-CM | POA: Diagnosis not present

## 2021-09-29 DIAGNOSIS — R6889 Other general symptoms and signs: Secondary | ICD-10-CM | POA: Diagnosis not present

## 2021-09-29 MED ORDER — FLUTICASONE PROPIONATE 50 MCG/ACT NA SUSP: 2.0000 | Freq: Every day | NASAL | 6 refills | Status: DC

## 2021-09-29 NOTE — Progress Notes (Signed)
Patient ID: Tyler Wise, male   DOB: Feb 07, 1951, 71 y.o.   MRN: [id number] ? ? ?Tyler Wise, is a 71 y.o. male ? ?[id number] ? ?[id number] ? ?DOB - 10-08-1950 ? ?No chief complaint on file. ?    ? ?Subjective:  ? ?Tyler Wise is a 71 y.o. male here today for his R thumb giving him trouble for a month or 2.  He is R hand dominant.  NKI.  R thumb has been "clicking" and feels as tho it gets stuck. No pain or swelling ? ?Also wants to see if prediabetes has improved/worsened.  He admits to poor diet and no exercise.   ? ?Also having some congestion and sneezing with allergies ? ?No problems updated. ? ?ALLERGIES: ?No Known Allergies ? ?PAST MEDICAL HISTORY: ?Past Medical History:  ?Diagnosis Date  ? Anemia   ? Colon cancer ([place]) 01/2021  ? Hyperlipidemia   ? Hypertension   ? Pre-diabetes   ? ? ?MEDICATIONS AT HOME: ?Prior to Admission medications   ?Medication Sig Start Date End Date Taking? Authorizing Provider  ?acetaminophen (TYLENOL) 500 MG tablet Take 2 tablets (1,000 mg total) by mouth every 6 (six) hours. 12/09/98  Yes Leighton Ruff, MD  ?amLODipine (NORVASC) 5 MG tablet Take 1 tablet (5 mg total) by mouth daily. 07/08/21  Yes Ladell Pier, MD  ?ammonium lactate (AMLACTIN) 12 % lotion Apply 1 application topically as needed for dry skin. 04/14/21  Yes Felipa Furnace, DPM  ?aspirin EC 81 MG tablet Take 81 mg by mouth daily. Swallow whole.   Yes [provider]  ?atorvastatin (LIPITOR) 10 MG tablet Take 10 mg by mouth daily.   Yes [provider]  ?ferrous sulfate 325 (65 FE) MG tablet Take 1 tablet (325 mg total) by mouth daily with breakfast. 05/11/21  Yes Ladell Pier, MD  ?fluticasone (FLONASE) 50 MCG/ACT nasal spray Place 2 sprays into both nostrils daily. 09/29/21  Yes Argentina Donovan, PA-C  ?tamsulosin (FLOMAX) 0.4 MG CAPS capsule Take 2 capsules (0.8 mg total) by mouth daily. 07/08/21  Yes Ladell Pier, MD  ? ? ?ROS: ?Neg resp ?Neg cardiac ?Neg GI ?Neg GU ?Neg  psych ?Neg neuro ? ?Objective:  ? ?Vitals:  ? 09/29/21 1134  ?BP: (!) 142/95  ?Pulse: 91  ?SpO2: 96%  ?Weight: 259 lb (117.5 kg)  ? ?Exam ?General appearance : Awake, alert, not in any distress. Speech Clear. Not toxic looking ?HEENT: Atraumatic and Normocephalic ?Chest: Good air entry bilaterally, CTAB.  No rales/rhonchi/wheezing ?CVS: S1 S2 regular, no murmurs.  ?R hand-normal grip strength and ROM.  R thumb "clicks"/pops with extension.  No noticeable swelling or erythema ?Extremities: B/L Lower Ext shows no edema, both legs are warm to touch ?Neurology: Awake alert, and oriented X 3, CN II-XII intact, Non focal ?Skin: No Rash ? ?Data Review ?Lab Results  ?Component Value Date  ? HGBA1C 6.3 (H) 03/08/2021  ? ? ?Assessment & Plan  ? ?1. Iron deficiency anemia due to chronic blood loss ?- CBC with Differential/Platelet ? ?2. Prediabetes ?I have had a lengthy discussion and provided education about insulin resistance and the intake of too much sugar/refined carbohydrates.  I have advised the patient to work at a goal of eliminating sugary drinks, candy, desserts, sweets, refined sugars, processed foods, and white carbohydrates.  The patient expresses understanding.  ?- Hemoglobin A1c ? ?3. Trigger finger of right thumb ?- Ambulatory referral to Hand Surgery ? ?4. Allergy, initial encounter ?- fluticasone (FLONASE) 50  MCG/ACT nasal spray; Place 2 sprays into both nostrils daily.  Dispense: 16 g; Refill: 6 ? ? ? ?Patient have been counseled extensively about nutrition and exercise. Other issues discussed during this visit include: low cholesterol diet, weight control and daily exercise, foot care, annual eye examinations at Ophthalmology, importance of adherence with medications and regular follow-up. We also discussed long term complications of uncontrolled diabetes and hypertension.  ? ?Return in about 4 months (around 01/29/2022) for PCP/chronic conditions. ? ?The patient was given clear instructions to go to ER or  return to medical center if symptoms don't improve, worsen or new problems develop. The patient verbalized understanding. The patient was told to call to get lab results if they haven't heard anything in the next week.  ? ? ? ? ?Freeman Caldron, PA-C ?[facility] ?[place], [place] ?[phone]   ?09/29/2021, 11:54 AM  ?

## 2021-09-29 NOTE — Patient Instructions (Signed)
Trigger Finger ?Trigger finger, also called stenosing tenosynovitis,  is a condition that causes a finger to get stuck in a bent position. Each finger has a tendon, which is a tough, cord-like tissue that connects muscle to bone, and each tendon passes through a tunnel of tissue called a tendon sheath. ?To move your finger, your tendon needs to glide freely through the sheath. Trigger finger happens when the tendon or the sheath thickens, making it difficult to move your finger. Trigger finger can affect any finger or a thumb. It may affect more than one finger. Mild cases may clear up with rest and medicine. Severe cases require more treatment. ?What are the causes? ?Trigger finger is caused by a thickened finger tendon or tendon sheath. The cause of this thickening is not known. ?What increases the risk? ?The following factors may make you more likely to develop this condition: ?Doing activities that require a strong grip. ?Having rheumatoid arthritis, gout, or diabetes. ?Being 84-53 years old. ?Being male. ?What are the signs or symptoms? ?Symptoms of this condition include: ?Pain when bending or straightening your finger. ?Tenderness or swelling where your finger attaches to the palm of your hand. ?A lump in the palm of your hand or on the inside of your finger. ?Hearing a noise like a pop or a snap when you try to straighten your finger. ?Feeling a catching or locking sensation when you try to straighten your finger. ?Being unable to straighten your finger. ?How is this diagnosed? ?This condition is diagnosed based on your symptoms and a physical exam. ?How is this treated? ?This condition may be treated by: ?Resting your finger and avoiding activities that make symptoms worse. ?Wearing a finger splint to keep your finger extended. ?Taking NSAIDs, such as ibuprofen, to relieve pain and swelling. ?Doing gentle exercises to stretch the finger as told by your health care provider. ?Having medicine that reduces  swelling and inflammation (steroids) injected into the tendon sheath. Injections may need to be repeated. ?Having surgery to open the tendon sheath. This may be done if other treatments do not work and you cannot straighten your finger. You may need physical therapy after surgery. ?Follow these instructions at home: ?If you have a splint: ?Wear the splint as told by your health care provider. Remove it only as told by your health care provider. ?Loosen it if your fingers tingle, become numb, or turn cold and blue. ?Keep it clean. ?If the splint is not waterproof: ?Do not let it get wet. ?Cover it with a watertight covering when you take a bath or shower. ?Managing pain, stiffness, and swelling ?  ?If directed, apply heat to the affected area as often as told by your health care provider. Use the heat source that your health care provider recommends, such as a moist heat pack or a heating pad. ?Place a towel between your skin and the heat source. ?Leave the heat on for 20-30 minutes. ?Remove the heat if your skin turns bright red. This is especially important if you are unable to feel pain, heat, or cold. You may have a greater risk of getting burned. ?If directed, put ice on the painful area. To do this: ?If you have a removable splint, remove it as told by your health care provider. ?Put ice in a plastic bag. ?Place a towel between your skin and the bag or between your splint and the bag. ?Leave the ice on for 20 minutes, 2-3 times a day. ? ?Activity ?Rest your finger as  told by your health care provider. Avoid activities that make the pain worse. ?Return to your normal activities as told by your health care provider. Ask your health care provider what activities are safe for you. ?Do exercises as told by your health care provider. ?Ask your health care provider when it is safe to drive if you have a splint on your hand. ?General instructions ?Take over-the-counter and prescription medicines only as told by your  health care provider. ?Keep all follow-up visits as told by your health care provider. This is important. ?Contact a health care provider if: ?Your symptoms are not improving with home care. ?Summary ?Trigger finger, also called stenosing tenosynovitis, causes your finger to get stuck in a bent position. This can make it difficult and painful to straighten your finger. ?This condition develops when a finger tendon or tendon sheath thickens. ?Treatment may include resting your finger, wearing a splint, and taking medicines. ?In severe cases, surgery to open the tendon sheath may be needed. ?This information is not intended to replace advice given to you by your health care provider. Make sure you discuss any questions you have with your health care provider. ?Document Revised: 11/26/2018 Document Reviewed: 11/26/2018 ?Elsevier Patient Education ? [employer]. ? ?

## 2021-09-30 ENCOUNTER — Other Ambulatory Visit: Payer: Self-pay | Admitting: Physician Assistant

## 2021-09-30 DIAGNOSIS — E1165 Type 2 diabetes mellitus with hyperglycemia: Secondary | ICD-10-CM

## 2021-09-30 LAB — HEMOGLOBIN A1C
Est. average glucose Bld gHb Est-mCnc: 140 mg/dL
Hgb A1c MFr Bld: 6.5 % — ABNORMAL HIGH (ref 4.8–5.6)

## 2021-09-30 LAB — CBC WITH DIFFERENTIAL/PLATELET
Basophils Absolute: 0.1 10*3/uL (ref 0.0–0.2)
Basos: 0 %
EOS (ABSOLUTE): 0.1 10*3/uL (ref 0.0–0.4)
Eos: 1 %
Hematocrit: 49.7 % (ref 37.5–51.0)
Hemoglobin: 16.1 g/dL (ref 13.0–17.7)
Immature Grans (Abs): 0 10*3/uL (ref 0.0–0.1)
Immature Granulocytes: 0 %
Lymphocytes Absolute: 1.5 10*3/uL (ref 0.7–3.1)
Lymphs: 14 %
MCH: 24.5 pg — ABNORMAL LOW (ref 26.6–33.0)
MCHC: 32.4 g/dL (ref 31.5–35.7)
MCV: 76 fL — ABNORMAL LOW (ref 79–97)
Monocytes Absolute: 0.8 10*3/uL (ref 0.1–0.9)
Monocytes: 8 %
Neutrophils Absolute: 8.6 10*3/uL — ABNORMAL HIGH (ref 1.4–7.0)
Neutrophils: 77 %
Platelets: 280 10*3/uL (ref 150–450)
RBC: 6.58 x10E6/uL — ABNORMAL HIGH (ref 4.14–5.80)
RDW: 19.5 % — ABNORMAL HIGH (ref 11.6–15.4)
WBC: 11.2 10*3/uL — ABNORMAL HIGH (ref 3.4–10.8)

## 2021-09-30 MED ORDER — METFORMIN HCL 500 MG PO TABS
500.0000 mg | ORAL_TABLET | Freq: Two times a day (BID) | ORAL | 3 refills | Status: DC
Start: 2021-09-30 — End: 2021-10-01

## 2021-10-01 ENCOUNTER — Other Ambulatory Visit: Payer: Self-pay | Admitting: *Deleted

## 2021-10-01 DIAGNOSIS — E1165 Type 2 diabetes mellitus with hyperglycemia: Secondary | ICD-10-CM

## 2021-10-01 MED ORDER — METFORMIN HCL 500 MG PO TABS: 500.0000 mg | ORAL_TABLET | Freq: Two times a day (BID) | ORAL | 3 refills | Status: DC

## 2021-10-05 ENCOUNTER — Encounter: Payer: Self-pay | Admitting: Orthopedic Surgery

## 2021-10-05 ENCOUNTER — Other Ambulatory Visit: Payer: Self-pay

## 2021-10-05 ENCOUNTER — Ambulatory Visit (INDEPENDENT_AMBULATORY_CARE_PROVIDER_SITE_OTHER): Payer: Medicare HMO | Admitting: Orthopedic Surgery

## 2021-10-05 DIAGNOSIS — M65311 Trigger thumb, right thumb: Secondary | ICD-10-CM

## 2021-10-05 DIAGNOSIS — R6889 Other general symptoms and signs: Secondary | ICD-10-CM | POA: Diagnosis not present

## 2021-10-05 MED ORDER — BETAMETHASONE SOD PHOS & ACET 6 (3-3) MG/ML IJ SUSP
6.0000 mg | INTRAMUSCULAR | Status: AC | PRN
  Administered 2021-10-05: 6 mg via INTRA_ARTICULAR

## 2021-10-05 MED ORDER — LIDOCAINE HCL 1 % IJ SOLN
1.0000 mL | INTRAMUSCULAR | Status: AC | PRN
Start: 2021-10-05 — End: 2021-10-05
  Administered 2021-10-05: 1 mL

## 2021-10-05 NOTE — Progress Notes (Signed)
? ?Office Visit Note ?  ?Patient: Tyler Wise           ?Date of Birth: 03/15/1951           ?MRN: [id number] ?Visit Date: 10/05/2021 ?             ?Requested by: Argentina Donovan, PA-C ?[address] ?[address] ?[place],  [place] [id number] ?PCP: Ladell Pier, MD ? ? ?Assessment & Plan: ?Visit Diagnoses:  ?1. Trigger thumb, right thumb   ? ? ?Plan: We discussed the diagnosis, prognosis, non-operative and operative treatment options for trigger thumb. ? ?After our discussion, the patient would like to proceed with right thumb corticosteroid injection.  We reviewed the risks and benefits of conservative management.  The patient expressed understanding of the reasoning and strategy going forward. ? ?All patient questions and concerns were addressed.   ? ?Follow-Up Instructions: No follow-ups on file.  ? ?Orders:  ?No orders of the defined types were placed in this encounter. ? ?No orders of the defined types were placed in this encounter. ? ? ? ? Procedures: ?Hand/UE Inj: R thumb A1 for trigger finger on 10/05/2021 11:01 AM ?Indications: tendon swelling and therapeutic ?Details: 25 G needle, volar approach ?Medications: 1 mL lidocaine 1 %; 6 mg betamethasone acetate-betamethasone sodium phosphate 6 (3-3) MG/ML ?Procedure, treatment alternatives, risks and benefits explained, specific risks discussed. Consent was given by the patient. Immediately prior to procedure a time out was called to verify the correct patient, procedure, equipment, support staff and site/side marked as required. Patient was prepped and draped in the usual sterile fashion.  ? ? ? ? ?Clinical Data: ?No additional findings. ? ? ?Subjective: ?Chief Complaint  ?Patient presents with  ? Right Thumb - Pain  ? ? ?Is a 70 year old right-hand-dominant male who presents with triggering of the right thumb.  Is been going on for about 2 months.  He notes that his thumb will lock at the IP joint when he tries to bend it or grab an object.  It is not  painful for him.  It is becoming more frequent.  He denies any triggering of any other fingers. ? ? ?Review of Systems ? ? ?Objective: ?Vital Signs: BP (!) 141/89 (BP Location: Left Arm, Patient Position: Sitting)   Pulse 94   Ht '5\' 9"'$  (1.753 m)   Wt 259 lb (117.5 kg)   BMI 38.25 kg/m?  ? ?Physical Exam ?Constitutional:   ?   Appearance: Normal appearance.  ?Cardiovascular:  ?   Rate and Rhythm: Normal rate.  ?   Pulses: Normal pulses.  ?Pulmonary:  ?   Effort: Pulmonary effort is normal.  ?Skin: ?   General: Skin is warm and dry.  ?   Capillary Refill: Capillary refill takes less than 2 seconds.  ?Neurological:  ?   Mental Status: He is alert.  ? ? ?Right Hand Exam  ? ?Tenderness  ?Right hand tenderness location: TTP at thumb A1 pulley with palpable nodule. ? ?Other  ?Erythema: absent ?Sensation: normal ?Pulse: present ? ?Comments:  Palpable and visible thumb triggering.  ? ? ? ? ?Specialty Comments:  ?No specialty comments available. ? ?Imaging: ?No results found. ? ? ?PMFS History: ?Patient Active Problem List  ? Diagnosis Date Noted  ? Trigger thumb, right thumb 10/05/2021  ? Essential hypertension 05/07/2021  ? Hyperlipidemia 05/07/2021  ? Obesity (BMI 30.0-34.9) 05/07/2021  ? Prediabetes 05/07/2021  ? Iron deficiency anemia due to chronic blood loss 05/07/2021  ? History  of colon cancer 05/07/2021  ? Colon cancer, ascending ([facility]) 03/10/2021  ? ?Past Medical History:  ?Diagnosis Date  ? Anemia   ? Colon cancer ([facility]) 01/2021  ? Hyperlipidemia   ? Hypertension   ? Pre-diabetes   ?  ?No family history on file.  ?Past Surgical History:  ?Procedure Laterality Date  ? LAPAROSCOPIC PARTIAL COLECTOMY N/A 03/10/2021  ? Procedure: LAPAROSCOPIC RIGHT PARTIAL COLECTOMY WITH A PRIMARY UMBILICAL HERNIA REPAIR;  Surgeon: Leighton Ruff, MD;  Location: [facility];  Service: General;  Laterality: N/A;  ? ?Social History  ? ?Occupational History  ? Not on file  ?Tobacco Use  ? Smoking status: Former  ?  Packs/day: 1.50  ?  Years:  3.00  ?  Pack years: 4.50  ?  Types: Cigarettes  ?  Quit date: 12/24/1974  ?  Years since quitting: 46.8  ? Smokeless tobacco: Never  ?Vaping Use  ? Vaping Use: Never used  ?Substance and Sexual Activity  ? Alcohol use: Not Currently  ? Drug use: Never  ? Sexual activity: Not on file  ? ? ? ? ? ? ?

## 2021-10-06 ENCOUNTER — Other Ambulatory Visit: Payer: Self-pay | Admitting: Internal Medicine

## 2021-10-06 NOTE — Telephone Encounter (Signed)
Requested Prescriptions  ?Pending Prescriptions Disp Refills  ?? amLODipine (NORVASC) 5 MG tablet [Pharmacy Med Name: AMLODIPINE BESYLATE 5 MG Tablet] 90 tablet 0  ?  Sig: TAKE 1 TABLET EVERY DAY  ?  ? Cardiovascular: Calcium Channel Blockers 2 Failed - 10/06/2021 10:20 AM  ?  ?  Failed - Last BP in normal range  ?  BP Readings from Last 1 Encounters:  ?10/05/21 (!) 141/89  ?   ?  ?  Passed - Last Heart Rate in normal range  ?  Pulse Readings from Last 1 Encounters:  ?10/05/21 94  ?   ?  ?  Passed - Valid encounter within last 6 months  ?  Recent Outpatient Visits   ?      ? 1 week ago Iron deficiency anemia due to chronic blood loss  ? [facility], [place], [place]  ? 5 months ago Encounter to establish care  ? [facility] Ladell Pier, MD  ?  ?  ?Future Appointments   ?        ? In 2 months Ladell Pier, MD [place]  ?  ? ?  ?  ?  ? ?

## 2021-11-03 ENCOUNTER — Other Ambulatory Visit: Payer: Self-pay | Admitting: Internal Medicine

## 2021-11-15 DIAGNOSIS — R6889 Other general symptoms and signs: Secondary | ICD-10-CM | POA: Diagnosis not present

## 2021-11-17 DIAGNOSIS — H524 Presbyopia: Secondary | ICD-10-CM | POA: Diagnosis not present

## 2021-11-17 DIAGNOSIS — H02831 Dermatochalasis of right upper eyelid: Secondary | ICD-10-CM | POA: Diagnosis not present

## 2021-11-17 DIAGNOSIS — R6889 Other general symptoms and signs: Secondary | ICD-10-CM | POA: Diagnosis not present

## 2021-11-17 DIAGNOSIS — R7303 Prediabetes: Secondary | ICD-10-CM | POA: Diagnosis not present

## 2021-11-17 DIAGNOSIS — H5203 Hypermetropia, bilateral: Secondary | ICD-10-CM | POA: Diagnosis not present

## 2021-11-17 DIAGNOSIS — H25813 Combined forms of age-related cataract, bilateral: Secondary | ICD-10-CM | POA: Diagnosis not present

## 2021-11-17 DIAGNOSIS — H52203 Unspecified astigmatism, bilateral: Secondary | ICD-10-CM | POA: Diagnosis not present

## 2021-11-17 DIAGNOSIS — H02834 Dermatochalasis of left upper eyelid: Secondary | ICD-10-CM | POA: Diagnosis not present

## 2021-11-22 DIAGNOSIS — H5213 Myopia, bilateral: Secondary | ICD-10-CM | POA: Diagnosis not present

## 2021-11-22 DIAGNOSIS — H52209 Unspecified astigmatism, unspecified eye: Secondary | ICD-10-CM | POA: Diagnosis not present

## 2021-12-30 ENCOUNTER — Encounter: Payer: Self-pay | Admitting: Internal Medicine

## 2021-12-30 ENCOUNTER — Ambulatory Visit: Payer: Medicare HMO | Attending: Internal Medicine | Admitting: Internal Medicine

## 2021-12-30 VITALS — BP 148/97 | HR 81 | Temp 98.1°F | Resp 16 | Wt 200.8 lb

## 2021-12-30 DIAGNOSIS — M25511 Pain in right shoulder: Secondary | ICD-10-CM | POA: Diagnosis not present

## 2021-12-30 DIAGNOSIS — I10 Essential (primary) hypertension: Secondary | ICD-10-CM

## 2021-12-30 DIAGNOSIS — E663 Overweight: Secondary | ICD-10-CM

## 2021-12-30 DIAGNOSIS — E785 Hyperlipidemia, unspecified: Secondary | ICD-10-CM

## 2021-12-30 DIAGNOSIS — D500 Iron deficiency anemia secondary to blood loss (chronic): Secondary | ICD-10-CM | POA: Diagnosis not present

## 2021-12-30 DIAGNOSIS — R6889 Other general symptoms and signs: Secondary | ICD-10-CM | POA: Diagnosis not present

## 2021-12-30 DIAGNOSIS — Z85038 Personal history of other malignant neoplasm of large intestine: Secondary | ICD-10-CM

## 2021-12-30 DIAGNOSIS — E1169 Type 2 diabetes mellitus with other specified complication: Secondary | ICD-10-CM

## 2021-12-30 LAB — GLUCOSE, POCT (MANUAL RESULT ENTRY): POC Glucose: 102 mg/dl — AB (ref 70–99)

## 2021-12-30 MED ORDER — AMLODIPINE BESYLATE 10 MG PO TABS: 10.0000 mg | ORAL_TABLET | Freq: Every day | ORAL | 1 refills | Status: DC

## 2021-12-30 NOTE — Progress Notes (Unsigned)
Patient ID: Tyler Wise, male    DOB: 1951/02/23  MRN: [id number]  CC: Chronic disease management  Subjective: Tyler Wise is a 71 y.o. male who presents for chronic ds management His concerns today include:  Patient with history of HTN: HL, DM, former tob user, history of colon cancer of the ascending colon (s/p partial colectomy 02/2021   Obesity/DM: On last visit with me in October, patient was 221 pounds.  Today he is 200 pounds.  Recorded weight in the system 3 months ago when he saw the physician assistant was 259 pounds.  We both agree that that was not an accurate reading.   Patient states he thought he had gained weight.  He has not had any major changes in clothes size but has had to tighten his belt. He has been eating less.  No red meat or pork. Saw PA 3 months ago and A1c was 6.5 in the range for diabetes.  Started on metformin 500 mg twice a day.  Reports diarrhea about once a week since starting the medication. -Had eye exam about 1 month ago.  History of iron deficiency anemia and colon CA: Due for repeat colonoscopy.  CBC in March showed hemoglobin increased from 10.6 to 16.1.  He was still on iron and recently ran out of it.  HTN: Reports compliance with Norvasc 5 mg daily and took it already today. Reports compliance with Lipitor.  Last LDL was 90.  Complains of pain in the right shoulder with extreme backward movement x1 month.  No known injury.  He does a lot of lifting of heavy grocery bags in both arms and wonder whether there was a strain. Patient Active Problem List   Diagnosis Date Noted   Trigger thumb, right thumb 10/05/2021   Essential hypertension 05/07/2021   Hyperlipidemia 05/07/2021   Obesity (BMI 30.0-34.9) 05/07/2021   Prediabetes 05/07/2021   Iron deficiency anemia due to chronic blood loss 05/07/2021   History of colon cancer 05/07/2021   Colon cancer, ascending ([facility]) 03/10/2021     Current Outpatient Medications on File Prior to Visit   Medication Sig Dispense Refill   amLODipine (NORVASC) 5 MG tablet TAKE 1 TABLET EVERY DAY 90 tablet 0   aspirin EC 81 MG tablet Take 81 mg by mouth daily. Swallow whole.     atorvastatin (LIPITOR) 10 MG tablet Take 10 mg by mouth daily.     bisacodyl (DULCOLAX) 5 MG EC tablet Take by mouth.     ferrous sulfate 325 (65 FE) MG tablet Take 1 tablet (325 mg total) by mouth daily with breakfast. 100 tablet 1   metFORMIN (GLUCOPHAGE) 500 MG tablet Take 1 tablet (500 mg total) by mouth 2 (two) times daily with a meal. 180 tablet 3   tamsulosin (FLOMAX) 0.4 MG CAPS capsule TAKE 2 CAPSULES EVERY DAY 180 capsule 1   acetaminophen (TYLENOL) 500 MG tablet Take 2 tablets (1,000 mg total) by mouth every 6 (six) hours. (Patient not taking: Reported on 12/30/2021) 30 tablet 0   traMADol (ULTRAM) 50 MG tablet Take by mouth. (Patient not taking: Reported on 12/30/2021)     No current facility-administered medications on file prior to visit.    No Known Allergies  Social History   Socioeconomic History   Marital status: Single    Spouse name: Not on file   Number of children: Not on file   Years of education: Not on file   Highest education level: Not on file  Occupational  History   Not on file  Tobacco Use   Smoking status: Former    Packs/day: 1.50    Years: 3.00    Total pack years: 4.50    Types: Cigarettes    Quit date: 12/24/1974    Years since quitting: 47.0   Smokeless tobacco: Never  Vaping Use   Vaping Use: Never used  Substance and Sexual Activity   Alcohol use: Not Currently   Drug use: Never   Sexual activity: Not on file  Other Topics Concern   Not on file  Social History Narrative   Not on file   Social Determinants of Health   Financial Resource Strain: Not on file  Food Insecurity: Not on file  Transportation Needs: Not on file  Physical Activity: Not on file  Stress: Not on file  Social Connections: Not on file  Intimate Partner Violence: Not on file    No family  history on file.  Past Surgical History:  Procedure Laterality Date   LAPAROSCOPIC PARTIAL COLECTOMY N/A 03/10/2021   Procedure: LAPAROSCOPIC RIGHT PARTIAL COLECTOMY WITH A PRIMARY UMBILICAL HERNIA REPAIR;  Surgeon: Leighton Ruff, MD;  Location: [facility];  Service: General;  Laterality: N/A;    ROS: Review of Systems Negative except as stated above  PHYSICAL EXAM: BP (!) 148/97   Pulse 81   Temp 98.1 F (36.7 C) (Oral)   Resp 16   Wt 200 lb 12.8 oz (91.1 kg)   SpO2 94%   BMI 29.65 kg/m   Wt Readings from Last 3 Encounters:  12/30/21 200 lb 12.8 oz (91.1 kg)  10/05/21 259 lb (117.5 kg)  09/29/21 259 lb (117.5 kg)  154/98  Physical Exam   General appearance - alert, well appearing, older African-American male and in no distress Mental status - normal mood, behavior, speech, dress, motor activity, and thought processes Neck - supple, no significant adenopathy Chest - clear to auscultation, no wheezes, rales or rhonchi, symmetric air entry Heart - normal rate, regular rhythm, normal S1, S2, no murmurs, rubs, clicks or gallops Musculoskeletal -right shoulder: No point tenderness of the joint.  Good range of motion.  Drop arm test negative. Extremities -no lower extremity edema.     Latest Ref Rng & Units 05/07/2021    9:40 AM 03/13/2021    5:58 AM 03/12/2021    4:36 AM  CMP  Glucose 70 - 99 mg/dL 92  105  100   BUN 8 - 27 mg/dL '15  11  8   '$ Creatinine 0.76 - 1.27 mg/dL 1.13  1.11  1.05   Sodium 134 - 144 mmol/L 141  142  139   Potassium 3.5 - 5.2 mmol/L 4.2  3.0  3.2   Chloride 96 - 106 mmol/L 105  108  109   CO2 20 - 29 mmol/L '21  26  25   '$ Calcium 8.6 - 10.2 mg/dL 8.8  8.1  7.8   Total Protein 6.0 - 8.5 g/dL 6.8     Total Bilirubin 0.0 - 1.2 mg/dL 0.4     Alkaline Phos 44 - 121 IU/L 116     AST 0 - 40 IU/L 22     ALT 0 - 44 IU/L 18      Lipid Panel     Component Value Date/Time   CHOL 164 05/07/2021 0940   TRIG 79 05/07/2021 0940   HDL 59 05/07/2021 0940    CHOLHDL 2.8 05/07/2021 0940   LDLCALC 90 05/07/2021 0940  CBC    Component Value Date/Time   WBC 11.2 (H) 09/29/2021 1258   WBC 11.1 (H) 03/15/2021 0408   RBC 6.58 (H) 09/29/2021 1258   RBC 3.88 (L) 03/15/2021 0408   HGB 16.1 09/29/2021 1258   HCT 49.7 09/29/2021 1258   PLT 280 09/29/2021 1258   MCV 76 (L) 09/29/2021 1258   MCH 24.5 (L) 09/29/2021 1258   MCH 21.6 (L) 03/15/2021 0408   MCHC 32.4 09/29/2021 1258   MCHC 30.8 03/15/2021 0408   RDW 19.5 (H) 09/29/2021 1258   LYMPHSABS 1.5 09/29/2021 1258   EOSABS 0.1 09/29/2021 1258   BASOSABS 0.1 09/29/2021 1258    ASSESSMENT AND PLAN:  1. Type 2 diabetes mellitus with hyperlipidemia (HCC) Based on last A1c, he has progressed from prediabetes to full diabetes. Discussed decreasing the dose of metformin because of the diarrhea.  Patient declined stating that he has diarrhea only once a week. Commended him on healthy eating habits.  Encouraged him to keep up the good work.  Discussed and encouraged regular exercise. - POCT glucose (manual entry) - Hemoglobin A1c - Microalbumin / creatinine urine ratio  2. Essential hypertension Not at goal.  Recommend increasing amlodipine to 10 mg daily.  Continue DASH diet. - amLODipine (NORVASC) 10 MG tablet; Take 1 tablet (10 mg total) by mouth daily.  Dispense: 90 tablet; Refill: 1  3. Overweight See #1 above.  4. Acute pain of right shoulder Discussed referring him to orthopedics or for some physical therapy on the shoulder.  Patient declined at this time.  He said if things get worse he will let me know.  5. Iron deficiency anemia due to chronic blood loss Advised to stop the iron supplement given recent hemoglobin has normalized.  6. History of colon cancer I forgot to inquire from patient whether he had his repeat colonoscopy in February of this year.  We will inquire on follow-up visit.   Patient was given the opportunity to ask questions.  Patient verbalized understanding of  the plan and was able to repeat key elements of the plan.   This documentation was completed using Radio producer.  Any transcriptional errors are unintentional.  Orders Placed This Encounter  Procedures   POCT glucose (manual entry)     Requested Prescriptions    No prescriptions requested or ordered in this encounter    No follow-ups on file.  Karle Plumber, MD, FACP

## 2021-12-30 NOTE — Patient Instructions (Signed)
Stop Iron supplement.   Increase Norvasc to 10 mg daily.

## 2021-12-30 NOTE — Progress Notes (Unsigned)
Patient is here for follow-up DM2.  Patient said his right shoulder hurts when he lifts his arm 10/10.

## 2021-12-31 LAB — HEMOGLOBIN A1C
Est. average glucose Bld gHb Est-mCnc: 134 mg/dL
Hgb A1c MFr Bld: 6.3 % — ABNORMAL HIGH (ref 4.8–5.6)

## 2021-12-31 LAB — MICROALBUMIN / CREATININE URINE RATIO
Creatinine, Urine: 83.2 mg/dL
Microalb/Creat Ratio: 97 mg/g creat — ABNORMAL HIGH (ref 0–29)
Microalbumin, Urine: 81 ug/mL

## 2021-12-31 NOTE — Progress Notes (Signed)
Let patient know that his diabetes is under good control.  His A1c was 6.3 with goal being less than 7.  He has a small amount of protein in the urine.  This can be an early indication of diabetes affecting the kidneys.  We will plan to recheck the urine again in about 4 months.  If no improvement or gets worse, we will add a low-dose of a blood pressure medication called lisinopril that helps protect the kidneys from the effects of diabetes.

## 2022-02-19 IMAGING — CT CT CHEST W/ CM
2 of 4 series · 15 of 36 positions shown, 18 images · IV contrast (OMNIPAQUE)
Comparison: None.

CLINICAL DATA: Metastatic colon cancer.

EXAM:
CT CHEST WITH CONTRAST
TECHNIQUE: Multidetector CT imaging of the chest was performed during
intravenous contrast administration.
CONTRAST:  60mL OMNIPAQUE IOHEXOL 350 MG/ML SOLN

[Series 2: axial st · axial · 0.85mm/px · z∈[+1350,+1698]mm · 12 of 204 slices shown, 15 images]
[im 15/204  mediastinal]
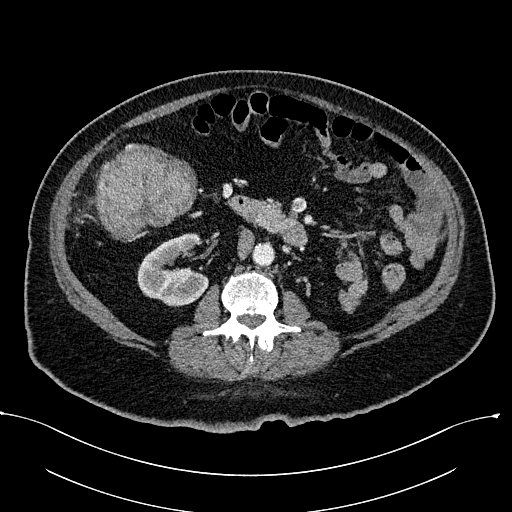
[im 15/204  lung]
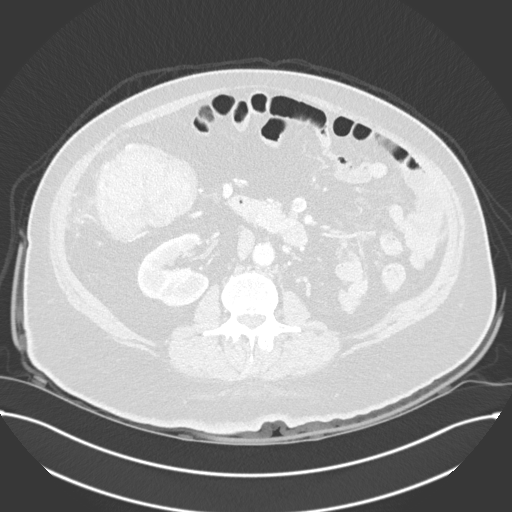
[im 30/204  lung]
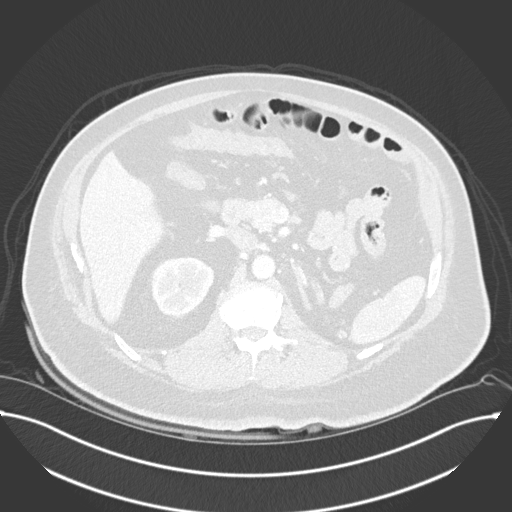
[im 44/204  lung]
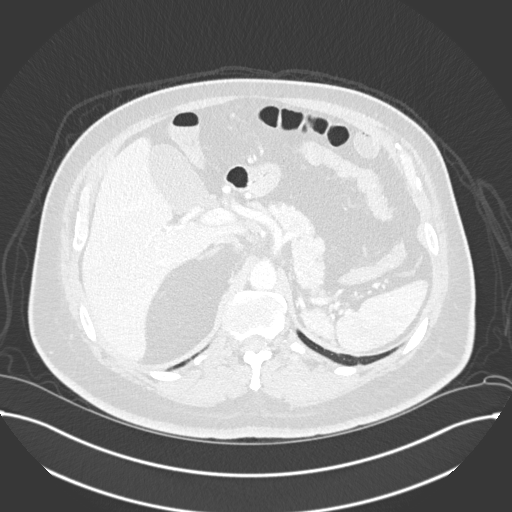
[im 59/204  lung]
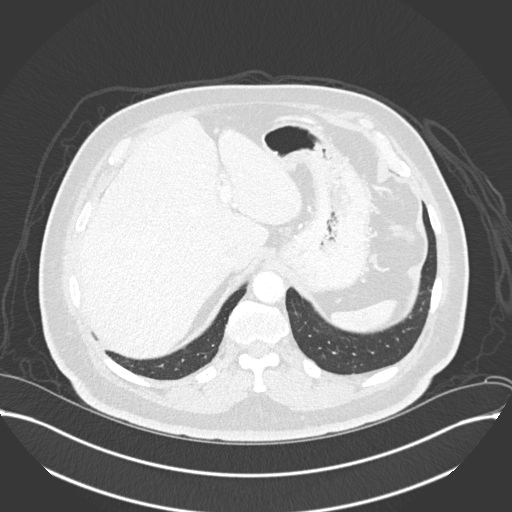
[im 73/204  mediastinal]
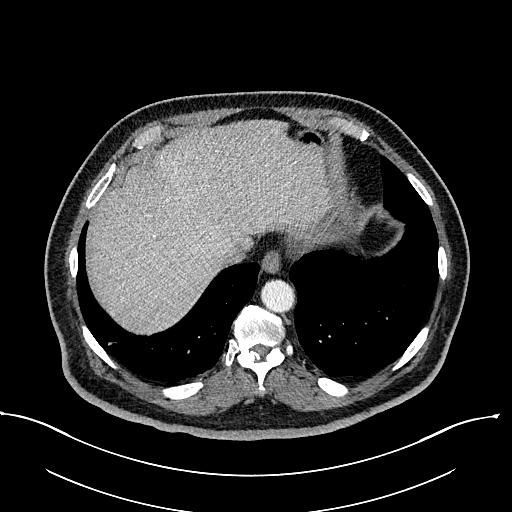
[im 73/204  lung]
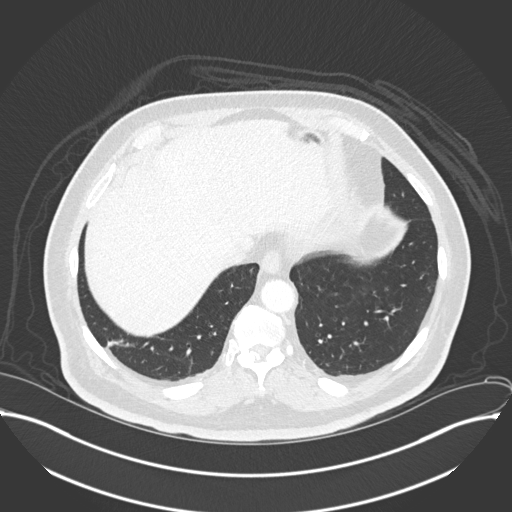
[im 88/204  lung]
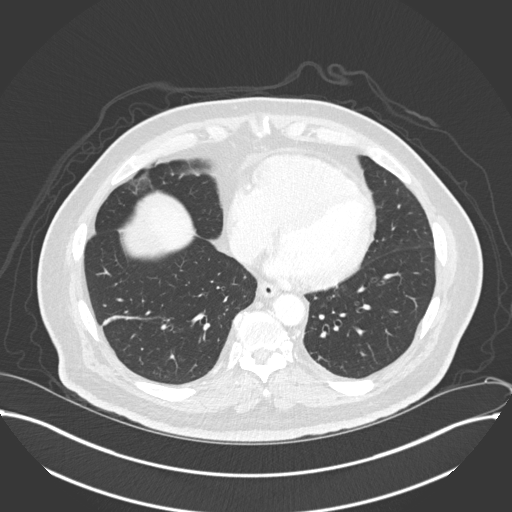
[im 117/204  lung]
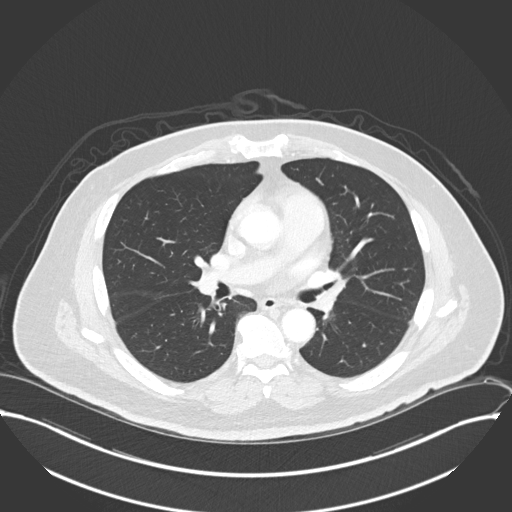
[im 131/204  lung]
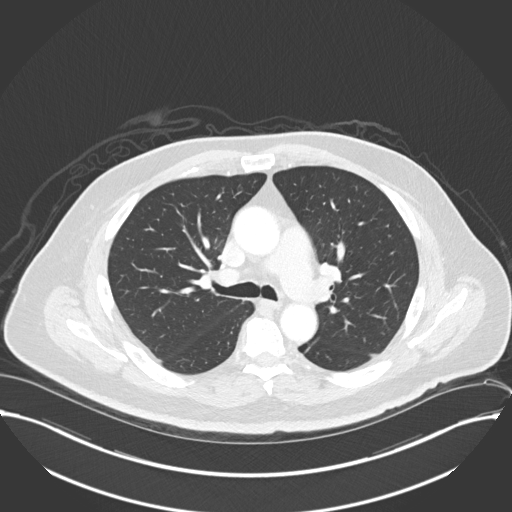
[im 146/204  mediastinal]
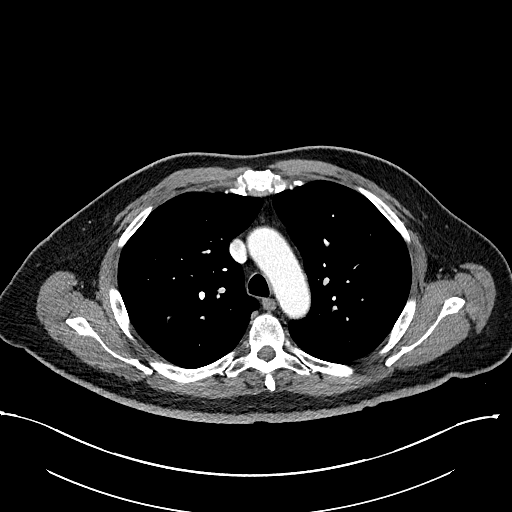
[im 146/204  lung]
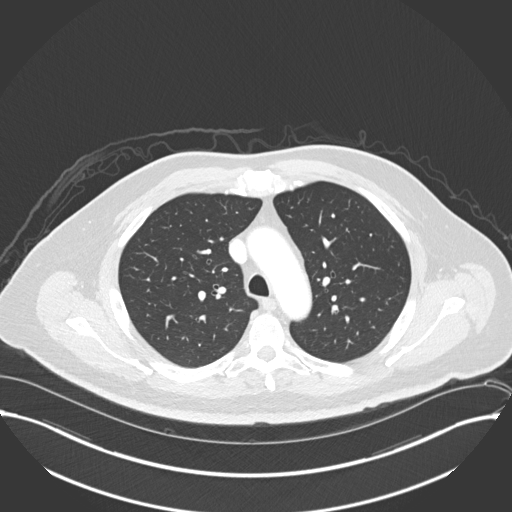
[im 160/204  lung]
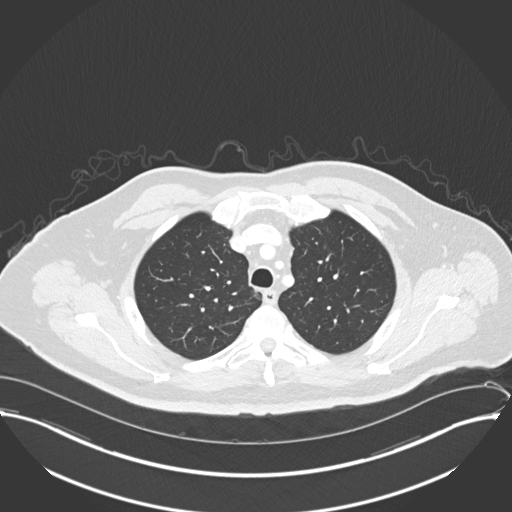
[im 175/204  lung]
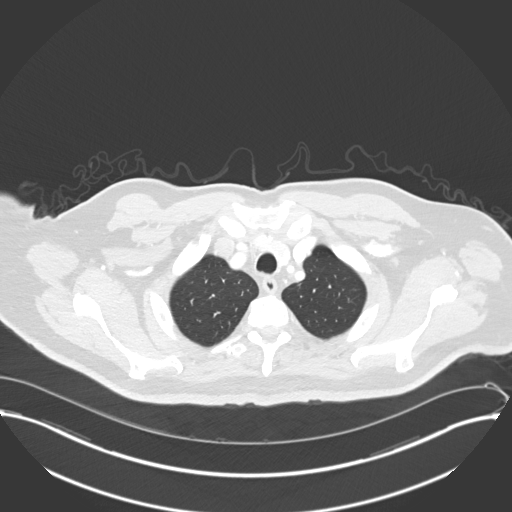
[im 189/204  lung]
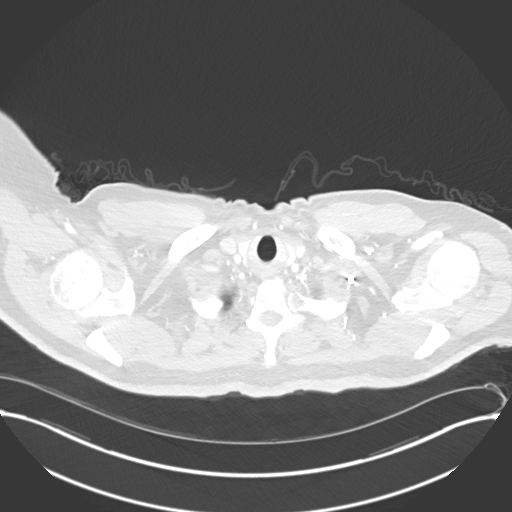

[Series 5: coronal · coronal · 0.82mm/px · 3 of 156 slices shown]
[im 32/156  lung]
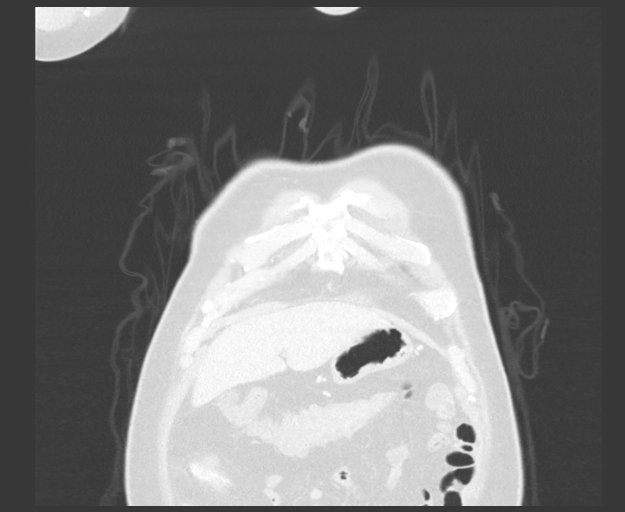
[im 63/156  lung]
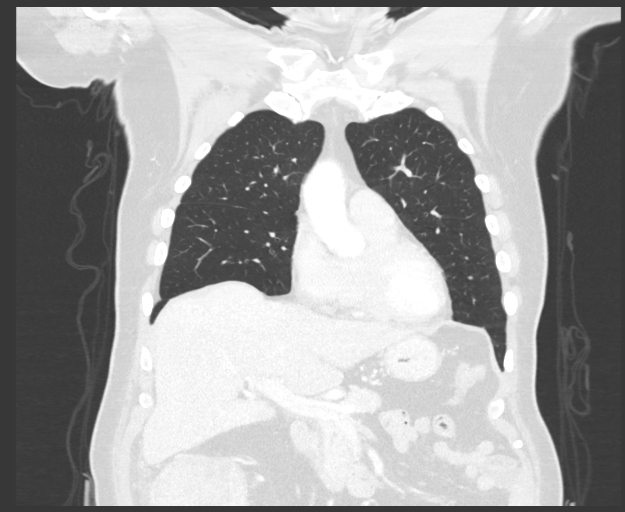
[im 94/156  lung]
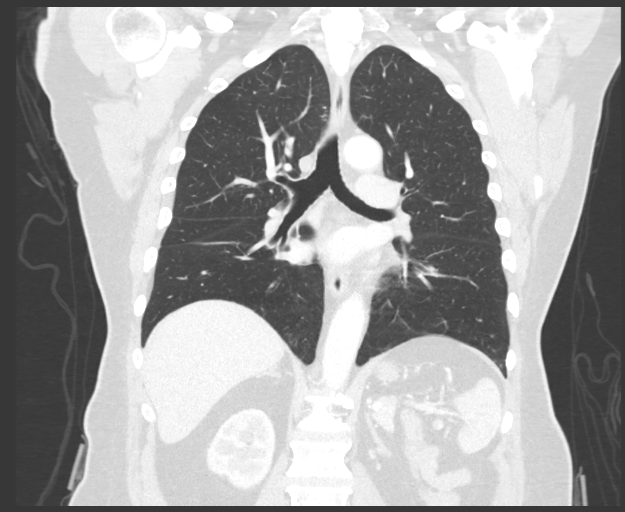

[15 of 36 positions shown; findings below may reference images not displayed]

FINDINGS: Cardiovascular: Heart size is normal. No pericardial effusion
identified. Mild aortic atherosclerosis.

Mediastinum/Nodes: Normal appearance of the thyroid gland. The
trachea appears patent and is midline. Normal appearance of the
esophagus. No enlarged axillary, supraclavicular, mediastinal, or
hilar lymph nodes.

Lungs/Pleura: No pleural effusion. Scar versus subsegmental
atelectasis is noted in the right lower lobe. Tiny nodule within the
posterior right lower lobe measures 3 mm, image 98/7.

Upper Abdomen: No acute abnormality. Partially visualized right
colon mass is again seen, image 197/2.

Musculoskeletal: No chest wall abnormality. No acute or significant
osseous findings.
IMPRESSION: 1. No specific findings identified to suggest metastatic disease to
the chest.
2. Tiny 3 mm nodule within the posterior right lower lobe is
identified. Although nonspecific attention in this nodule on future
surveillance imaging is advised.
3. Partially visualized right colon mass is again seen.
4. Aortic atherosclerosis.

Aortic Atherosclerosis ([id number]).

## 2022-04-01 DIAGNOSIS — N401 Enlarged prostate with lower urinary tract symptoms: Secondary | ICD-10-CM | POA: Diagnosis not present

## 2022-04-29 ENCOUNTER — Encounter: Payer: Self-pay | Admitting: Internal Medicine

## 2022-04-29 ENCOUNTER — Ambulatory Visit: Payer: Medicare HMO | Attending: Internal Medicine | Admitting: Internal Medicine

## 2022-04-29 VITALS — BP 126/81 | HR 98 | Temp 97.6°F | Ht 69.0 in | Wt 256.0 lb

## 2022-04-29 DIAGNOSIS — E785 Hyperlipidemia, unspecified: Secondary | ICD-10-CM | POA: Diagnosis not present

## 2022-04-29 DIAGNOSIS — E669 Obesity, unspecified: Secondary | ICD-10-CM | POA: Diagnosis not present

## 2022-04-29 DIAGNOSIS — E1169 Type 2 diabetes mellitus with other specified complication: Secondary | ICD-10-CM | POA: Diagnosis not present

## 2022-04-29 DIAGNOSIS — I152 Hypertension secondary to endocrine disorders: Secondary | ICD-10-CM | POA: Diagnosis not present

## 2022-04-29 DIAGNOSIS — R6889 Other general symptoms and signs: Secondary | ICD-10-CM | POA: Diagnosis not present

## 2022-04-29 DIAGNOSIS — R972 Elevated prostate specific antigen [PSA]: Secondary | ICD-10-CM | POA: Diagnosis not present

## 2022-04-29 DIAGNOSIS — E1159 Type 2 diabetes mellitus with other circulatory complications: Secondary | ICD-10-CM | POA: Diagnosis not present

## 2022-04-29 DIAGNOSIS — Z85038 Personal history of other malignant neoplasm of large intestine: Secondary | ICD-10-CM

## 2022-04-29 NOTE — Progress Notes (Signed)
Patient ID: Tyler Wise, male    DOB: 12-15-1950  MRN: [id number]  CC: chronic ds management   Subjective: Tyler Wise is a 71 y.o. male who presents for chronic ds management His concerns today include:  Patient with history of HTN: HL, DM, former tob user, history of colon cancer of the ascending colon (s/p partial colectomy 02/2021)  Reports being called by [facility] a few wks ago and told that he has an appt. When he went, he was told it was a blood draw only.  Did blood draw.  He was called a few days later and told his level suggest he may have prostate CA and that he needs a follow-up appointment with Dr. Gilford Rile.  Patient refused the appointment stating that he wanted Dr. Gilford Rile to touch base with me and explain what testing he had done and why.  He tells me that he had seen the urologist sometime last year before after his partial colectomy.   No blood in urine. No problems passing urine since being on Flomax   HTN:  taking Norvasc daily as prescribed.  No device to check blood pressure.  No chest pains or shortness of breath.  HL:  taking and tolerating Lipitor  DM:  taking Metformin 500 mg once a day suppose to be BID.  It seems he was a bit confused about the instructions from last visit.  He denies any diarrhea on the medication. Has cut back on sugary snacks and white carbs. Does some walking several times a week to get to and from the bus stop because he does not drive.  Hx of colon CA: He had partial colectomy in August of last year.  He tells me he has not had follow-up with Dr. Marcello Moores or surveillance colonoscopy.     HM:  had flu shot last mth at [facility].  Had 2 shingles vaccine in [place] 2 yrs ago.  Had PCV at [facility] on [address].   Patient Active Problem List   Diagnosis Date Noted   Trigger thumb, right thumb 10/05/2021   Essential hypertension 05/07/2021   Hyperlipidemia 05/07/2021   Obesity (BMI 30.0-34.9) 05/07/2021   Prediabetes 05/07/2021    Iron deficiency anemia due to chronic blood loss 05/07/2021   History of colon cancer 05/07/2021     Current Outpatient Medications on File Prior to Visit  Medication Sig Dispense Refill   amLODipine (NORVASC) 10 MG tablet Take 1 tablet (10 mg total) by mouth daily. 90 tablet 1   aspirin EC 81 MG tablet Take 81 mg by mouth daily. Swallow whole.     atorvastatin (LIPITOR) 10 MG tablet Take 10 mg by mouth daily.     metFORMIN (GLUCOPHAGE) 500 MG tablet Take 1 tablet (500 mg total) by mouth 2 (two) times daily with a meal. 180 tablet 3   tamsulosin (FLOMAX) 0.4 MG CAPS capsule TAKE 2 CAPSULES EVERY DAY 180 capsule 1   No current facility-administered medications on file prior to visit.    No Known Allergies  Social History   Socioeconomic History   Marital status: Single    Spouse name: Not on file   Number of children: Not on file   Years of education: Not on file   Highest education level: Not on file  Occupational History   Not on file  Tobacco Use   Smoking status: Former    Packs/day: 1.50    Years: 3.00    Total pack years: 4.50    Types:  Cigarettes    Quit date: 12/24/1974    Years since quitting: 47.3   Smokeless tobacco: Never  Vaping Use   Vaping Use: Never used  Substance and Sexual Activity   Alcohol use: Not Currently   Drug use: Never   Sexual activity: Not on file  Other Topics Concern   Not on file  Social History Narrative   Not on file   Social Determinants of Health   Financial Resource Strain: Not on file  Food Insecurity: Not on file  Transportation Needs: Not on file  Physical Activity: Not on file  Stress: Not on file  Social Connections: Not on file  Intimate Partner Violence: Not on file    No family history on file.  Past Surgical History:  Procedure Laterality Date   LAPAROSCOPIC PARTIAL COLECTOMY N/A 03/10/2021   Procedure: LAPAROSCOPIC RIGHT PARTIAL COLECTOMY WITH A PRIMARY UMBILICAL HERNIA REPAIR;  Surgeon: Leighton Ruff, MD;   Location: [facility];  Service: General;  Laterality: N/A;    ROS: Review of Systems Negative except as stated above  PHYSICAL EXAM: BP 126/81   Pulse 98   Temp 97.6 F (36.4 C) (Oral)   Ht '5\' 9"'$  (1.753 m)   Wt 256 lb (116.1 kg)   SpO2 97%   BMI 37.80 kg/m   Wt Readings from Last 3 Encounters:  04/29/22 256 lb (116.1 kg)  12/30/21 200 lb 12.8 oz (91.1 kg)  10/05/21 259 lb (117.5 kg)    Physical Exam  General appearance - alert, well appearing, obese older male and in no distress Mental status - normal mood, behavior, speech, dress, motor activity, and thought processes Mouth - mucous membranes moist, pharynx normal without lesions Neck - supple, no significant adenopathy Chest - clear to auscultation, no wheezes, rales or rhonchi, symmetric air entry Heart - normal rate, regular rhythm, normal S1, S2, no murmurs, rubs, clicks or gallops Extremities - peripheral pulses normal, no pedal edema, no clubbing or cyanosis      Latest Ref Rng & Units 05/07/2021    9:40 AM 03/13/2021    5:58 AM 03/12/2021    4:36 AM  CMP  Glucose 70 - 99 mg/dL 92  105  100   BUN 8 - 27 mg/dL '15  11  8   '$ Creatinine 0.76 - 1.27 mg/dL 1.13  1.11  1.05   Sodium 134 - 144 mmol/L 141  142  139   Potassium 3.5 - 5.2 mmol/L 4.2  3.0  3.2   Chloride 96 - 106 mmol/L 105  108  109   CO2 20 - 29 mmol/L '21  26  25   '$ Calcium 8.6 - 10.2 mg/dL 8.8  8.1  7.8   Total Protein 6.0 - 8.5 g/dL 6.8     Total Bilirubin 0.0 - 1.2 mg/dL 0.4     Alkaline Phos 44 - 121 IU/L 116     AST 0 - 40 IU/L 22     ALT 0 - 44 IU/L 18      Lipid Panel     Component Value Date/Time   CHOL 164 05/07/2021 0940   TRIG 79 05/07/2021 0940   HDL 59 05/07/2021 0940   CHOLHDL 2.8 05/07/2021 0940   LDLCALC 90 05/07/2021 0940    CBC    Component Value Date/Time   WBC 11.2 (H) 09/29/2021 1258   WBC 11.1 (H) 03/15/2021 0408   RBC 6.58 (H) 09/29/2021 1258   RBC 3.88 (L) 03/15/2021 0408   HGB 16.1  09/29/2021 1258   HCT 49.7  09/29/2021 1258   PLT 280 09/29/2021 1258   MCV 76 (L) 09/29/2021 1258   MCH 24.5 (L) 09/29/2021 1258   MCH 21.6 (L) 03/15/2021 0408   MCHC 32.4 09/29/2021 1258   MCHC 30.8 03/15/2021 0408   RDW 19.5 (H) 09/29/2021 1258   LYMPHSABS 1.5 09/29/2021 1258   EOSABS 0.1 09/29/2021 1258   BASOSABS 0.1 09/29/2021 1258    ASSESSMENT AND PLAN: 1. Diabetes mellitus type 2 in obese [facility]) Patient to continue metformin.  I told him to take the metformin twice a day as prescribed.  We will check an A1c today.  Encouraged him to continue healthy eating habits and trying to move as much as he can. - Hemoglobin A1c  2. Hypertension associated with type 2 diabetes mellitus ([place]) Close to goal.  Continue Norvasc.  3. Hyperlipidemia associated with type 2 diabetes mellitus ([facility]) Continue atorvastatin.  4. History of colon cancer We will get him back in with Dr. Marcello Moores as he is due for surveillance with CT and colonoscopy. - Ambulatory referral to General Surgery - CBC  5. Abnormal PSA Advised patient that if the urologist office called him and informed him that the test done which sounds like it was a PSA, came back abnormal, he should follow-up with them to discuss what if anything needs to be done.  Patient is still insisting that I try to touch base with Dr. Lovena Neighbours.  I told him that I will try to do so next week on my administrative day.  However in the meantime I have requested that he sign a release of information for [place] to get his records from the urologist.  In regards to his immunizations, I will have our clinical pharmacist call [facility] so that we can update his health maintenance with the flu vaccine and pneumonia vaccine that he states he received.  We will also have him check the [facility] immunization database to see if and when he had the shingles vaccines.  Patient was given the opportunity to ask questions.  Patient verbalized understanding of the plan and was able to repeat key  elements of the plan.   This documentation was completed using Radio producer.  Any transcriptional errors are unintentional.  Orders Placed This Encounter  Procedures   CBC   Hemoglobin A1c   Ambulatory referral to General Surgery     Requested Prescriptions    No prescriptions requested or ordered in this encounter    Return in about 4 months (around 08/30/2022) for Sign release to get records from [facility] and [facility].  Karle Plumber, MD, Rosalita Chessman

## 2022-04-30 LAB — HEMOGLOBIN A1C
Est. average glucose Bld gHb Est-mCnc: 131 mg/dL
Hgb A1c MFr Bld: 6.2 % — ABNORMAL HIGH (ref 4.8–5.6)

## 2022-04-30 LAB — CBC
Hematocrit: 45.5 % (ref 37.5–51.0)
Hemoglobin: 15.2 g/dL (ref 13.0–17.7)
MCH: 25.9 pg — ABNORMAL LOW (ref 26.6–33.0)
MCHC: 33.4 g/dL (ref 31.5–35.7)
MCV: 77 fL — ABNORMAL LOW (ref 79–97)
Platelets: 329 10*3/uL (ref 150–450)
RBC: 5.88 x10E6/uL — ABNORMAL HIGH (ref 4.14–5.80)
RDW: 17.2 % — ABNORMAL HIGH (ref 11.6–15.4)
WBC: 10.9 10*3/uL — ABNORMAL HIGH (ref 3.4–10.8)

## 2022-05-04 ENCOUNTER — Telehealth: Payer: Self-pay | Admitting: Internal Medicine

## 2022-05-04 NOTE — Telephone Encounter (Signed)
Phone call placed to patient this evening. Patient informed that I did call [facility] today and spoke with one of the RNs.  I was informed that he was seen there last year and the other day that they called him was for a 1 year follow-up.  The nurse told me that his PSA last year was 13.6 and when it was checked a few weeks ago it came back at 17.3.  Dr. Lovena Neighbours wanted to meet with him for his yearly visit and to discuss the persistent elevation in his PSA.  I advised patient that he should follow through with seeing the urologist to have a discussion of what this elevation could possibly mean, and options for him at this time of whether he would want a prostate biopsy or not.  Patient thanked me for calling [facility] to clarify things.  He states he will give them a call tomorrow and schedule a follow-up appointment.

## 2022-05-18 ENCOUNTER — Encounter: Payer: Self-pay | Admitting: Internal Medicine

## 2022-05-18 NOTE — Progress Notes (Signed)
See if patient's chart from [facility]. CBC in July of last year revealed WBC 14.2, PLT 608. Last fit test 02/09/2021 was negative. Iron studies done 10/29/2020: Iron 13/5% sat/ferritin 34/TIBC 281 PSA 12.5 Vitamin B12 296 Patient with history of BPH PSA 10/02/2020: 14.28

## 2022-05-30 ENCOUNTER — Other Ambulatory Visit: Payer: Self-pay | Admitting: Internal Medicine

## 2022-05-30 DIAGNOSIS — I10 Essential (primary) hypertension: Secondary | ICD-10-CM

## 2022-06-01 ENCOUNTER — Ambulatory Visit: Payer: Self-pay | Admitting: General Surgery

## 2022-06-01 DIAGNOSIS — C182 Malignant neoplasm of ascending colon: Secondary | ICD-10-CM

## 2022-06-01 DIAGNOSIS — R6889 Other general symptoms and signs: Secondary | ICD-10-CM | POA: Diagnosis not present

## 2022-06-01 DIAGNOSIS — K5140 Inflammatory polyps of colon without complications: Secondary | ICD-10-CM | POA: Diagnosis not present

## 2022-06-01 NOTE — H&P (Signed)
PROVIDER:  Monico Blitz, MD   MRN: [id number] DOB: 07/08/1951 DATE OF ENCOUNTER: 06/01/2022 Interval History:    Tyler Wise is a 71 y.o. male who is seen today for postoperative visit after surgery for colon cancer.   Patient underwent a CT scan of the abdomen pelvis due to nausea vomiting and diarrhea associated with weight loss earlier this year.  This showed a mass in the right colon.  He was sent for colonoscopy.  Biopsies showed adenocarcinoma.  Mass was located in the ascending colon and tattooed distally.  There appears to also be a 5 cm polyp in the descending colon approximately 50 cm from the anal verge.  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  This was tattooed proximally and distally.  Patient also had a umbilical hernia on CT scan.  CT chest was neg.  He underwent a laparoscopic assisted right colectomy and primary hernia repair in Aug 22.      Past Medical History:  Diagnosis Date   Anemia    Colon cancer ([place]) 01/2021   Hyperlipidemia    Hypertension    Pre-diabetes    Past Surgical History:  Procedure Laterality Date   LAPAROSCOPIC PARTIAL COLECTOMY N/A 03/10/2021   Procedure: LAPAROSCOPIC RIGHT PARTIAL COLECTOMY WITH A PRIMARY UMBILICAL HERNIA REPAIR;  Surgeon: Leighton Ruff, MD;  Location: [facility];  Service: General;  Laterality: N/A;   Social History   Socioeconomic History   Marital status: Single    Spouse name: Not on file   Number of children: Not on file   Years of education: Not on file   Highest education level: Not on file  Occupational History   Not on file  Tobacco Use   Smoking status: Former    Packs/day: 1.50    Years: 3.00    Total pack years: 4.50    Types: Cigarettes    Quit date: 12/24/1974    Years since quitting: 47.4   Smokeless tobacco: Never  Vaping Use   Vaping Use: Never used  Substance and Sexual Activity   Alcohol use: Not Currently   Drug use: Never   Sexual activity: Not on  file  Other Topics Concern   Not on file  Social History Narrative   Not on file   Social Determinants of Health   Financial Resource Strain: Not on file  Food Insecurity: Not on file  Transportation Needs: Not on file  Physical Activity: Not on file  Stress: Not on file  Social Connections: Not on file  Intimate Partner Violence: Not on file   No family history on file. Review of Systems - Negative except as stated above   Current Outpatient Medications:    amLODipine (NORVASC) 10 MG tablet, TAKE 1 TABLET EVERY DAY, Disp: 90 tablet, Rfl: 1   aspirin EC 81 MG tablet, Take 81 mg by mouth daily. Swallow whole., Disp: , Rfl:    atorvastatin (LIPITOR) 10 MG tablet, Take 10 mg by mouth daily., Disp: , Rfl:    metFORMIN (GLUCOPHAGE) 500 MG tablet, Take 1 tablet (500 mg total) by mouth 2 (two) times daily with a meal., Disp: 180 tablet, Rfl: 3   tamsulosin (FLOMAX) 0.4 MG CAPS capsule, TAKE 2 CAPSULES EVERY DAY, Disp: 180 capsule, Rfl: 1  No Known Allergies  Physical Examination:    Gen: NAD Abd: soft, incisional hernia, reducible   Assessment and Plan:    Tyler Wise is a 71 y.o. male who underwent laparoscopic right colectomy  and hernia repair for right-sided colon cancer in 2022.  Final pathology showed a T3N0 adenocarcinoma.  He is here today to discuss resecting his descending colon polyp.  We discussed this in detail.  All questions were answered.  He would like to wait until after the new year to have surgery.   The surgery and anatomy were described to the patient as well as the risks of surgery and the possible complications.  These include: Bleeding, deep abdominal infections and possible wound complications such as hernia and infection, damage to adjacent structures, leak of surgical connections, which can lead to other surgeries and possibly an ostomy, possible need for other procedures, such as abscess drains in radiology, possible prolonged hospital stay, possible  diarrhea from removal of part of the colon, possible constipation from narcotics, possible bowel, bladder or sexual dysfunction if having rectal surgery, prolonged fatigue/weakness or appetite loss, possible early recurrence of of disease, possible complications of their medical problems such as heart disease or arrhythmias or lung problems, death (less than 1%). I believe the patient understands and wishes to proceed with the surgery.      The plan was discussed in detail with the patient today, who expressed understanding.  The patient has my contact information, and understands to call me with any additional questions or concerns in the interval.  I would be happy to see the patient back sooner if the need arises.    Rosario Adie, MD Colon and Rectal Surgery [facility]

## 2022-07-04 ENCOUNTER — Other Ambulatory Visit: Payer: Self-pay | Admitting: Internal Medicine

## 2022-07-07 ENCOUNTER — Telehealth: Payer: Self-pay

## 2022-07-07 NOTE — Telephone Encounter (Signed)
Copied from [place] [phone]. Topic: General - Other >> Jul 07, 2022 12:23 PM Leitha Schuller wrote: Pt states he is diabetic and was informed by Mcarthur Rossetti it is not showing up in his chart  Pt requesting a cb to discuss furher

## 2022-07-18 NOTE — Progress Notes (Signed)
COVID Vaccine received:  _0  No _1  Yes Date of any COVID positive Test in last 90 days:  PCP -Karle Plumber, MD   [place] [facility]ologist -   Chest x-ray - CT chest 03-09-2021 EKG -   Stress Test -  ECHO -  Cardiac Cath -   PCR screen: _2  Ordered & Completed                      _3   No Order but Needs PROFEND                      _4   N/A for this surgery  Surgery Plan:  _5  Ambulatory                            _6  Outpatient in bed                            _7  Admit  Anesthesia:    _8  General  _9  Spinal                           _10   Choice _11   MAC  Bowel Prep - _12  No  _13   Yes _Miralax, Dulcolax etc__  Pacemaker / ICD device _14  No _15  Yes        Device order form faxed _16  No    _17   Yes      Faxed to:  Spinal Cord Stimulator:_18  No _19  Yes      (Remind patient to bring remote DOS) Other Implants:   History of Sleep Apnea? _20  No _21  Yes   CPAP used?- _22  No _23  Yes    PRE-DM Does the patient monitor blood sugar? _24  No _25  Yes  _26  N/A Does patient have a [employer] or Dexacom? _27  No _28  Yes   Fasting Blood Sugar Ranges-  Checks Blood Sugar _____ times a day  METFORMIN - Take as usual the day before surgery. DO NOT take the day of your surgery (07-27-2022)  Blood Thinner / Instructions: none Aspirin Instructions: ASA 46m,  No Hold, Ok to take per Dr. TMarcello Moores ERAS Protocol Ordered: _29  No  _30  Yes PRE-SURGERY _31  ENSURE  _32  G2 x3 Patient is to be NPO after: 0530  Comments: Patient had partial colectomy 02-2021 also  Activity level: Patient can / can not climb a flight of stairs without difficulty; _33  No CP  _34  No SOB, but would have ______   Patient can / can not perform ADLs without assistance.   Anesthesia review: HTN, Pre-DM, Anemia  Patient denies shortness of breath, fever, cough and chest pain at PAT appointment.  Patient verbalized understanding and agreement to the Pre-Surgical Instructions that were given to them at this PAT appointment.  Patient was also educated of the need to review these PAT instructions again prior to his/her surgery.I reviewed the appropriate phone numbers to call if they have any and questions or concerns.

## 2022-07-18 NOTE — Patient Instructions (Addendum)
SURGICAL WAITING ROOM VISITATION Patients having surgery or a procedure may have no more than 2 support people in the waiting area - these visitors may rotate in the visitor waiting room.   Due to an increase in RSV and influenza rates and associated hospitalizations, children ages 82 and under may not visit patients in [place]. If the patient needs to stay at the hospital during part of their recovery, the visitor guidelines for inpatient rooms apply.  PRE-OP VISITATION  Pre-op nurse will coordinate an appropriate time for 1 support person to accompany the patient in pre-op.  This support person may not rotate.  This visitor will be contacted when the time is appropriate for the visitor to come back in the pre-op area.  Please refer to the [facility] website for the visitor guidelines for Inpatients (after your surgery is over and you are in a regular room).  You are not required to quarantine at this time prior to your surgery. However, you must do this: Hand Hygiene often Do NOT share personal items Notify your provider if you are in close contact with someone who has COVID or you develop fever 100.4 or greater, new onset of sneezing, cough, sore throat, shortness of breath or body aches.  If you test positive for Covid or have been in contact with anyone that has tested positive in the last 10 days please notify you surgeon.    Your procedure is scheduled on:  Wednesday  July 27, 2022  Report to [facility] Main Entrance: [address] entrance where the [employer] is available.   Report to admitting at: 06:15 AM  +++++Call this number if you have any questions or problems the morning of surgery [phone]  FOLLOW BOWEL PREP AND ANY ADDITIONAL PRE OP INSTRUCTIONS YOU RECEIVED FROM YOUR SURGEON'S OFFICE!!! Dulcolax 20  mg - Take one (1) tablet with water at 07:00 am the day prior to surgery.  Miralax 255 g - Mix with 64 oz Gatorade/Powerade.  Starting at 10:00  am ,Drink this gradually over the next few hours (8 oz glass every 15-30 minutes) until gone the day prior to surgery You should finish in 4 hours-6 hours.    Drink plenty of clear liquids all evening to avoid getting dehydrated.   Do not eat food after Midnight the night prior to your surgery/procedure.  After Midnight you may have the following liquids until 05:30 AM DAY OF SURGERY  Clear Liquid Diet Water Black Coffee (sugar ok, NO MILK/CREAM OR CREAMERS)  Tea (sugar ok, NO MILK/CREAM OR CREAMERS) regular and decaf                             Plain Jell-O  with no fruit (NO RED)                                           Fruit ices (not with fruit pulp, NO RED)                                     Popsicles (NO RED)  Juice: apple, WHITE grape, WHITE cranberry Sports drinks like Gatorade or Powerade (NO RED)   DRINK two (2) bottles of Pre-Surgery Clear Ensure starting at 6:00 pm the evening prior to your surgery to help prevent dehydration. Increase drinking clear fluids (see list above)                         The day of surgery:  Drink ONE (1) Pre-Surgery G2 at  05:30 AM the morning of surgery. Drink in one sitting. Do not sip.  This drink was given to you during your hospital pre-op appointment visit. Nothing else to drink after completing the Pre-Surgery G2 : No candy, chewing gum or throat lozenges.     Oral Hygiene is also important to reduce your risk of infection.        Remember - BRUSH YOUR TEETH THE MORNING OF SURGERY WITH YOUR REGULAR TOOTHPASTE  Take ONLY these medicines the morning of surgery with A SIP OF WATER: amlodipine, Tamsulosin,   You may not have any metal on your body including jewelry, and body piercing  Do not wear lotions, powders, cologne, or deodorant  Men may shave face and neck.  Contacts, Hearing Aids, dentures or bridgework may not be worn into surgery. DENTURES WILL BE REMOVED  PRIOR TO SURGERY PLEASE DO NOT APPLY "Poly grip" OR ADHESIVES!!!  You may bring a small overnight bag with you on the day of surgery, only pack items that are not valuable. [facility] IS NOT RESPONSIBLE   FOR VALUABLES THAT ARE LOST OR STOLEN.   Do not bring your home medications to the hospital. The Pharmacy will dispense medications listed on your medication list to you during your admission in the Hospital.  Special Instructions: Bring a copy of your healthcare power of attorney and living will documents the day of surgery, if you wish to have them scanned into your [facility] Records- EPIC  Please read over the following fact sheets you were given: IF YOU HAVE QUESTIONS ABOUT YOUR PRE-OP INSTRUCTIONS, PLEASE CALL [phone]  ([place])   [facility] - Preparing for Surgery Before surgery, you can play an important role.  Because skin is not sterile, your skin needs to be as free of germs as possible.  You can reduce the number of germs on your skin by washing with CHG (chlorahexidine gluconate) soap before surgery.  CHG is an antiseptic cleaner which kills germs and bonds with the skin to continue killing germs even after washing. Please DO NOT use if you have an allergy to CHG or antibacterial soaps.  If your skin becomes reddened/irritated stop using the CHG and inform your nurse when you arrive at Short Stay. Do not shave (including legs and underarms) for at least 48 hours prior to the first CHG shower.  You may shave your face/neck.  Please follow these instructions carefully:  1.  Shower with CHG Soap the night before surgery and the  morning of surgery.  2.  If you choose to wash your hair, wash your hair first as usual with your normal  shampoo.  3.  After you shampoo, rinse your hair and body thoroughly to remove the shampoo.                             4.  Use CHG as you would any other liquid soap.  You can apply chg directly to the skin and wash.  Gently with a scrungie or  clean washcloth.  5.  Apply the CHG Soap to your body ONLY FROM THE NECK DOWN.   Do not use on face/ open                           Wound or open sores. Avoid contact with eyes, ears mouth and genitals (private parts).                       Wash face,  Genitals (private parts) with your normal soap.             6.  Wash thoroughly, paying special attention to the area where your  surgery  will be performed.  7.  Thoroughly rinse your body with warm water from the neck down.  8.  DO NOT shower/wash with your normal soap after using and rinsing off the CHG Soap.            9.  Pat yourself dry with a clean towel.            10.  Wear clean pajamas.            11.  Place clean sheets on your bed the night of your first shower and do not  sleep with pets.  ON THE DAY OF SURGERY : Do not apply any lotions/deodorants the morning of surgery.  Please wear clean clothes to the hospital/surgery center.    FAILURE TO FOLLOW THESE INSTRUCTIONS MAY RESULT IN THE CANCELLATION OF YOUR SURGERY  PATIENT SIGNATURE_________________________________  NURSE SIGNATURE__________________________________  ________________________________________________________________________       Adam Phenix    An incentive spirometer is a tool that can help keep your lungs clear and active. This tool measures how well you are filling your lungs with each breath. Taking long deep breaths may help reverse or decrease the chance of developing breathing (pulmonary) problems (especially infection) following: A long period of time when you are unable to move or be active. BEFORE THE PROCEDURE  If the spirometer includes an indicator to show your best effort, your nurse or respiratory therapist will set it to a desired goal. If possible, sit up straight or lean slightly forward. Try not to slouch. Hold the incentive spirometer in an upright position. INSTRUCTIONS FOR USE  Sit on the edge of your bed if possible,  or sit up as far as you can in bed or on a chair. Hold the incentive spirometer in an upright position. Breathe out normally. Place the mouthpiece in your mouth and seal your lips tightly around it. Breathe in slowly and as deeply as possible, raising the piston or the ball toward the top of the column. Hold your breath for 3-5 seconds or for as long as possible. Allow the piston or ball to fall to the bottom of the column. Remove the mouthpiece from your mouth and breathe out normally. Rest for a few seconds and repeat Steps 1 through 7 at least 10 times every 1-2 hours when you are awake. Take your time and take a few normal breaths between deep breaths. The spirometer may include an indicator to show your best effort. Use the indicator as a goal to work toward during each repetition. After each set of 10 deep breaths, practice coughing to be sure your lungs are clear. If you have an incision (the cut made at the time of surgery), support your incision when coughing  by placing a pillow or rolled up towels firmly against it. Once you are able to get out of bed, walk around indoors and cough well. You may stop using the incentive spirometer when instructed by your caregiver.  RISKS AND COMPLICATIONS Take your time so you do not get dizzy or light-headed. If you are in pain, you may need to take or ask for pain medication before doing incentive spirometry. It is harder to take a deep breath if you are having pain. AFTER USE Rest and breathe slowly and easily. It can be helpful to keep track of a log of your progress. Your caregiver can provide you with a simple table to help with this. If you are using the spirometer at home, follow these instructions: [place] IF:  You are having difficultly using the spirometer. You have trouble using the spirometer as often as instructed. Your pain medication is not giving enough relief while using the spirometer. You develop fever of 100.5 F (38.1  C) or higher.                                                                                                    SEEK IMMEDIATE MEDICAL CARE IF:  You cough up bloody sputum that had not been present before. You develop fever of 102 F (38.9 C) or greater. You develop worsening pain at or near the incision site. MAKE SURE YOU:  Understand these instructions. Will watch your condition. Will get help right away if you are not doing well or get worse. Document Released: 11/21/2006 Document Revised: 10/03/2011 Document Reviewed: 01/22/2007 [facility] Patient Information 2014 [place], [place].

## 2022-07-20 ENCOUNTER — Encounter (HOSPITAL_COMMUNITY): Payer: Self-pay

## 2022-07-20 ENCOUNTER — Other Ambulatory Visit: Payer: Self-pay

## 2022-07-20 ENCOUNTER — Encounter (HOSPITAL_COMMUNITY)
Admission: RE | Admit: 2022-07-20 | Discharge: 2022-07-20 | Disposition: A | Discharge status: Home / Self Care | Payer: Medicare HMO | Source: Ambulatory Visit | Attending: General Surgery | Admitting: General Surgery

## 2022-07-20 VITALS — BP 110/80 | HR 100 | Temp 98.4°F | Resp 22 | Ht 69.0 in | Wt 250.0 lb

## 2022-07-20 DIAGNOSIS — C182 Malignant neoplasm of ascending colon: Secondary | ICD-10-CM | POA: Insufficient documentation

## 2022-07-20 DIAGNOSIS — Z01818 Encounter for other preprocedural examination: Secondary | ICD-10-CM | POA: Diagnosis not present

## 2022-07-20 DIAGNOSIS — I10 Essential (primary) hypertension: Secondary | ICD-10-CM | POA: Diagnosis not present

## 2022-07-20 DIAGNOSIS — R7303 Prediabetes: Secondary | ICD-10-CM | POA: Diagnosis not present

## 2022-07-20 DIAGNOSIS — R6889 Other general symptoms and signs: Secondary | ICD-10-CM | POA: Diagnosis not present

## 2022-07-20 HISTORY — DX: Unspecified osteoarthritis, unspecified site: M19.90

## 2022-07-20 LAB — CBC
HCT: 48.2 % (ref 39.0–52.0)
Hemoglobin: 15.3 g/dL (ref 13.0–17.0)
MCH: 24.5 pg — ABNORMAL LOW (ref 26.0–34.0)
MCHC: 31.7 g/dL (ref 30.0–36.0)
MCV: 77.2 fL — ABNORMAL LOW (ref 80.0–100.0)
Platelets: 383 10*3/uL (ref 150–400)
RBC: 6.24 MIL/uL — ABNORMAL HIGH (ref 4.22–5.81)
RDW: 18.5 % — ABNORMAL HIGH (ref 11.5–15.5)
WBC: 10.3 10*3/uL (ref 4.0–10.5)
nRBC: 0 % (ref 0.0–0.2)

## 2022-07-20 LAB — BASIC METABOLIC PANEL
Anion gap: 9 (ref 5–15)
BUN: 13 mg/dL (ref 8–23)
CO2: 24 mmol/L (ref 22–32)
Calcium: 8.9 mg/dL (ref 8.9–10.3)
Chloride: 108 mmol/L (ref 98–111)
Creatinine, Ser: 1.3 mg/dL — ABNORMAL HIGH (ref 0.61–1.24)
GFR, Estimated: 58 mL/min — ABNORMAL LOW (ref >60)
Glucose, Bld: 101 mg/dL — ABNORMAL HIGH (ref 70–99)
Potassium: 4.3 mmol/L (ref 3.5–5.1)
Sodium: 141 mmol/L (ref 135–145)

## 2022-07-20 LAB — GLUCOSE, CAPILLARY: Glucose-Capillary: 93 mg/dL (ref 70–99)

## 2022-07-20 LAB — HEMOGLOBIN A1C
Hgb A1c MFr Bld: 6.4 % — ABNORMAL HIGH (ref 4.8–5.6)
Mean Plasma Glucose: 137 mg/dL

## 2022-07-21 LAB — CEA: CEA: 1.7 ng/mL (ref 0.0–4.7)

## 2022-07-25 ENCOUNTER — Telehealth: Payer: Self-pay | Admitting: Internal Medicine

## 2022-07-25 NOTE — Telephone Encounter (Signed)
Received message from [place] last wk that pt wanted to speak with me about his upcoming surgery. Call placed to pt today.  Pt reports he is scheduled to have c-scope to have some colon polys removed later this wk by Dr. Marcello Moores.  Wanted to make sure I was aware.  Also reports that [facility] has a rewards program for pts with DM; a $16 certificate.  When he last spoke with nurse, reports being told DM is not on his chart.  I assured him that it is.  He will check back with his insurance to clarify.

## 2022-07-26 NOTE — Anesthesia Preprocedure Evaluation (Signed)
Anesthesia Evaluation  Patient identified by MRN, date of birth, ID band Patient awake    Reviewed: Allergy & Precautions, NPO status , Patient's Chart, lab work & pertinent test results  Airway Mallampati: II  TM Distance: >3 FB Neck ROM: Full    Dental no notable dental hx. (+) Teeth Intact, Dental Advisory Given   Pulmonary former smoker   Pulmonary exam normal breath sounds clear to auscultation       Cardiovascular hypertension, Pt. on medications Normal cardiovascular exam Rhythm:Regular Rate:Normal     Neuro/Psych    GI/Hepatic negative GI ROS, Neg liver ROS,,,Colon Ca   Endo/Other    Renal/GU negative Renal ROSLab Results      Component                Value               Date                      CREATININE               1.30 (H)            07/20/2022                BUN                      13                  07/20/2022                NA                       141                 07/20/2022                K                        4.3                 07/20/2022                CL                       108                 07/20/2022                CO2                      24                  07/20/2022                Musculoskeletal  (+) Arthritis ,    Abdominal  (+) + obese (BMI 36.92)  Peds  Hematology Lab Results      Component                Value               Date                      WBC  10.3                07/20/2022                HGB                      15.3                07/20/2022                HCT                      48.2                07/20/2022                MCV                      77.2 (L)            07/20/2022                PLT                      383                 07/20/2022              Anesthesia Other Findings   Reproductive/Obstetrics                             Anesthesia Physical Anesthesia Plan  ASA: 3  Anesthesia Plan:  General   Post-op Pain Management: Ketamine IV*, Lidocaine infusion* and Tylenol PO (pre-op)*   Induction: Intravenous  PONV Risk Score and Plan: 3 and Treatment may vary due to age or medical condition, Ondansetron and Midazolam  Airway Management Planned: Oral ETT  Additional Equipment: None  Intra-op Plan:   Post-operative Plan: Extubation in OR  Informed Consent: I have reviewed the patients History and Physical, chart, labs and discussed the procedure including the risks, benefits and alternatives for the proposed anesthesia with the patient or authorized representative who has indicated his/her understanding and acceptance.     Dental advisory given  Plan Discussed with: Anesthesiologist, CRNA and Surgeon  Anesthesia Plan Comments:         Anesthesia Quick Evaluation

## 2022-07-27 ENCOUNTER — Inpatient Hospital Stay (HOSPITAL_COMMUNITY): Payer: Medicare HMO | Admitting: Anesthesiology

## 2022-07-27 ENCOUNTER — Other Ambulatory Visit: Payer: Self-pay

## 2022-07-27 ENCOUNTER — Encounter (HOSPITAL_COMMUNITY): Payer: Self-pay | Admitting: General Surgery

## 2022-07-27 ENCOUNTER — Inpatient Hospital Stay (HOSPITAL_COMMUNITY)
Admission: RE | Admit: 2022-07-27 | Discharge: 2022-07-31 | DRG: 331 | Disposition: A | Discharge status: Home / Self Care | Payer: Medicare HMO | Attending: General Surgery | Admitting: General Surgery

## 2022-07-27 ENCOUNTER — Encounter (HOSPITAL_COMMUNITY)
Admission: RE | Disposition: A | Discharge status: Home / Self Care | Payer: Self-pay | Source: Home / Self Care | Attending: General Surgery

## 2022-07-27 DIAGNOSIS — K668 Other specified disorders of peritoneum: Secondary | ICD-10-CM | POA: Diagnosis not present

## 2022-07-27 DIAGNOSIS — D374 Neoplasm of uncertain behavior of colon: Secondary | ICD-10-CM | POA: Diagnosis not present

## 2022-07-27 DIAGNOSIS — Q438 Other specified congenital malformations of intestine: Secondary | ICD-10-CM

## 2022-07-27 DIAGNOSIS — Z87891 Personal history of nicotine dependence: Secondary | ICD-10-CM

## 2022-07-27 DIAGNOSIS — E785 Hyperlipidemia, unspecified: Secondary | ICD-10-CM | POA: Diagnosis not present

## 2022-07-27 DIAGNOSIS — K635 Polyp of colon: Secondary | ICD-10-CM

## 2022-07-27 DIAGNOSIS — D124 Benign neoplasm of descending colon: Secondary | ICD-10-CM | POA: Diagnosis not present

## 2022-07-27 DIAGNOSIS — Z9049 Acquired absence of other specified parts of digestive tract: Secondary | ICD-10-CM | POA: Diagnosis not present

## 2022-07-27 DIAGNOSIS — I10 Essential (primary) hypertension: Secondary | ICD-10-CM | POA: Diagnosis not present

## 2022-07-27 DIAGNOSIS — K432 Incisional hernia without obstruction or gangrene: Secondary | ICD-10-CM | POA: Diagnosis not present

## 2022-07-27 DIAGNOSIS — Z85038 Personal history of other malignant neoplasm of large intestine: Secondary | ICD-10-CM | POA: Diagnosis not present

## 2022-07-27 DIAGNOSIS — M1990 Unspecified osteoarthritis, unspecified site: Secondary | ICD-10-CM | POA: Diagnosis not present

## 2022-07-27 DIAGNOSIS — R7303 Prediabetes: Secondary | ICD-10-CM

## 2022-07-27 LAB — TYPE AND SCREEN
ABO/RH(D): A POS
Antibody Screen: NEGATIVE

## 2022-07-27 LAB — GLUCOSE, CAPILLARY: Glucose-Capillary: 105 mg/dL — ABNORMAL HIGH (ref 70–99)

## 2022-07-27 SURGERY — COLECTOMY, PARTIAL, ROBOT-ASSISTED, LAPAROSCOPIC
Anesthesia: General | Site: Abdomen

## 2022-07-27 MED ORDER — BUPIVACAINE LIPOSOME 1.3 % IJ SUSP
INTRAMUSCULAR | Status: DC | PRN
  Administered 2022-07-27: 20 mL

## 2022-07-27 MED ORDER — LACTATED RINGERS IV SOLN: INTRAVENOUS | Status: DC | PRN

## 2022-07-27 MED ORDER — HYDROMORPHONE HCL 1 MG/ML IJ SOLN
0.2500 mg | INTRAMUSCULAR | Status: DC | PRN
  Administered 2022-07-27: 0.25 mg via INTRAVENOUS

## 2022-07-27 MED ORDER — ALVIMOPAN 12 MG PO CAPS
12.0000 mg | ORAL_CAPSULE | ORAL | Status: AC
  Administered 2022-07-27: 12 mg via ORAL
  Filled 2022-07-27: qty 1

## 2022-07-27 MED ORDER — ENSURE PRE-SURGERY PO LIQD
592.0000 mL | Freq: Once | ORAL | Status: DC
  Filled 2022-07-27: qty 592

## 2022-07-27 MED ORDER — KETAMINE HCL 10 MG/ML IJ SOLN
INTRAMUSCULAR | Status: AC
  Filled 2022-07-27: qty 1

## 2022-07-27 MED ORDER — DIPHENHYDRAMINE HCL 50 MG/ML IJ SOLN: 12.5000 mg | Freq: Four times a day (QID) | INTRAMUSCULAR | Status: DC | PRN

## 2022-07-27 MED ORDER — PROPOFOL 10 MG/ML IV BOLUS
INTRAVENOUS | Status: AC
  Filled 2022-07-27: qty 20

## 2022-07-27 MED ORDER — FENTANYL CITRATE (PF) 100 MCG/2ML IJ SOLN
INTRAMUSCULAR | Status: DC | PRN
  Administered 2022-07-27: 100 ug via INTRAVENOUS

## 2022-07-27 MED ORDER — SACCHAROMYCES BOULARDII 250 MG PO CAPS
250.0000 mg | ORAL_CAPSULE | Freq: Two times a day (BID) | ORAL | Status: DC
  Administered 2022-07-27 – 2022-07-31 (×8): 250 mg via ORAL
  Filled 2022-07-27 (×8): qty 1

## 2022-07-27 MED ORDER — SIMETHICONE 80 MG PO CHEW
40.0000 mg | CHEWABLE_TABLET | Freq: Four times a day (QID) | ORAL | Status: DC | PRN
  Filled 2022-07-27: qty 1

## 2022-07-27 MED ORDER — BUPIVACAINE-EPINEPHRINE 0.5% -1:200000 IJ SOLN
INTRAMUSCULAR | Status: DC | PRN
  Administered 2022-07-27: 30 mL

## 2022-07-27 MED ORDER — INDOCYANINE GREEN 25 MG IV SOLR
INTRAVENOUS | Status: DC | PRN
  Administered 2022-07-27: 5 mg via INTRAVENOUS

## 2022-07-27 MED ORDER — ROCURONIUM BROMIDE 10 MG/ML (PF) SYRINGE
PREFILLED_SYRINGE | INTRAVENOUS | Status: DC | PRN
  Administered 2022-07-27: 30 mg via INTRAVENOUS
  Administered 2022-07-27: 70 mg via INTRAVENOUS

## 2022-07-27 MED ORDER — ENOXAPARIN SODIUM 40 MG/0.4ML IJ SOSY
40.0000 mg | PREFILLED_SYRINGE | INTRAMUSCULAR | Status: DC
  Administered 2022-07-28 – 2022-07-31 (×4): 40 mg via SUBCUTANEOUS
  Filled 2022-07-27 (×4): qty 0.4

## 2022-07-27 MED ORDER — SUGAMMADEX SODIUM 200 MG/2ML IV SOLN
INTRAVENOUS | Status: DC | PRN
  Administered 2022-07-27: 350 mg via INTRAVENOUS

## 2022-07-27 MED ORDER — KETAMINE HCL 10 MG/ML IJ SOLN
INTRAMUSCULAR | Status: DC | PRN
  Administered 2022-07-27 (×4): 10 mg via INTRAVENOUS

## 2022-07-27 MED ORDER — EPHEDRINE 5 MG/ML INJ
INTRAVENOUS | Status: AC
  Filled 2022-07-27: qty 5

## 2022-07-27 MED ORDER — BISACODYL 5 MG PO TBEC: 20.0000 mg | DELAYED_RELEASE_TABLET | Freq: Once | ORAL | Status: DC

## 2022-07-27 MED ORDER — MIDAZOLAM HCL 5 MG/5ML IJ SOLN
INTRAMUSCULAR | Status: DC | PRN
  Administered 2022-07-27: 2 mg via INTRAVENOUS

## 2022-07-27 MED ORDER — EPHEDRINE SULFATE-NACL 50-0.9 MG/10ML-% IV SOSY
PREFILLED_SYRINGE | INTRAVENOUS | Status: DC | PRN
  Administered 2022-07-27: 10 mg via INTRAVENOUS
  Administered 2022-07-27: 5 mg via INTRAVENOUS
  Administered 2022-07-27 (×2): 10 mg via INTRAVENOUS

## 2022-07-27 MED ORDER — DEXAMETHASONE SODIUM PHOSPHATE 10 MG/ML IJ SOLN
INTRAMUSCULAR | Status: AC
  Filled 2022-07-27: qty 1

## 2022-07-27 MED ORDER — HYDROMORPHONE HCL 1 MG/ML IJ SOLN: 0.5000 mg | INTRAMUSCULAR | Status: DC | PRN

## 2022-07-27 MED ORDER — ACETAMINOPHEN 500 MG PO TABS
1000.0000 mg | ORAL_TABLET | Freq: Four times a day (QID) | ORAL | Status: DC
  Administered 2022-07-27 – 2022-07-31 (×17): 1000 mg via ORAL
  Filled 2022-07-27 (×17): qty 2

## 2022-07-27 MED ORDER — LIDOCAINE HCL (PF) 2 % IJ SOLN
INTRAMUSCULAR | Status: AC
  Filled 2022-07-27: qty 5

## 2022-07-27 MED ORDER — SODIUM CHLORIDE 0.9 % IV SOLN
2.0000 g | INTRAVENOUS | Status: AC
  Administered 2022-07-27: 2 g via INTRAVENOUS
  Filled 2022-07-27: qty 2

## 2022-07-27 MED ORDER — ALVIMOPAN 12 MG PO CAPS: 12.0000 mg | ORAL_CAPSULE | Freq: Two times a day (BID) | ORAL | Status: DC

## 2022-07-27 MED ORDER — TRAMADOL HCL 50 MG PO TABS
50.0000 mg | ORAL_TABLET | Freq: Four times a day (QID) | ORAL | Status: DC | PRN
  Filled 2022-07-27: qty 2

## 2022-07-27 MED ORDER — CHLORHEXIDINE GLUCONATE 0.12 % MT SOLN
15.0000 mL | Freq: Once | OROMUCOSAL | Status: AC
  Administered 2022-07-27: 15 mL via OROMUCOSAL

## 2022-07-27 MED ORDER — PHENYLEPHRINE 80 MCG/ML (10ML) SYRINGE FOR IV PUSH (FOR BLOOD PRESSURE SUPPORT)
PREFILLED_SYRINGE | INTRAVENOUS | Status: DC | PRN
  Administered 2022-07-27: 80 ug via INTRAVENOUS
  Administered 2022-07-27: 160 ug via INTRAVENOUS
  Administered 2022-07-27: 80 ug via INTRAVENOUS
  Administered 2022-07-27: 160 ug via INTRAVENOUS
  Administered 2022-07-27: 80 ug via INTRAVENOUS

## 2022-07-27 MED ORDER — ONDANSETRON HCL 4 MG/2ML IJ SOLN
INTRAMUSCULAR | Status: DC | PRN
  Administered 2022-07-27: 4 mg via INTRAVENOUS

## 2022-07-27 MED ORDER — PHENYLEPHRINE HCL-NACL 20-0.9 MG/250ML-% IV SOLN
INTRAVENOUS | Status: DC | PRN
  Administered 2022-07-27: 65 ug/min via INTRAVENOUS

## 2022-07-27 MED ORDER — KCL IN DEXTROSE-NACL 20-5-0.45 MEQ/L-%-% IV SOLN
INTRAVENOUS | Status: DC
  Filled 2022-07-27: qty 1000

## 2022-07-27 MED ORDER — ORAL CARE MOUTH RINSE: 15.0000 mL | Freq: Once | OROMUCOSAL | Status: AC

## 2022-07-27 MED ORDER — BUPIVACAINE LIPOSOME 1.3 % IJ SUSP
INTRAMUSCULAR | Status: AC
  Filled 2022-07-27: qty 20

## 2022-07-27 MED ORDER — ONDANSETRON HCL 4 MG/2ML IJ SOLN
INTRAMUSCULAR | Status: AC
  Filled 2022-07-27: qty 2

## 2022-07-27 MED ORDER — BUPIVACAINE-EPINEPHRINE (PF) 0.5% -1:200000 IJ SOLN
INTRAMUSCULAR | Status: AC
  Filled 2022-07-27: qty 30

## 2022-07-27 MED ORDER — ROCURONIUM BROMIDE 10 MG/ML (PF) SYRINGE
PREFILLED_SYRINGE | INTRAVENOUS | Status: AC
  Filled 2022-07-27: qty 10

## 2022-07-27 MED ORDER — ENSURE SURGERY PO LIQD
237.0000 mL | Freq: Two times a day (BID) | ORAL | Status: DC
  Administered 2022-07-27 – 2022-07-31 (×2): 237 mL via ORAL

## 2022-07-27 MED ORDER — ONDANSETRON HCL 4 MG PO TABS: 4.0000 mg | ORAL_TABLET | Freq: Four times a day (QID) | ORAL | Status: DC | PRN

## 2022-07-27 MED ORDER — PROPOFOL 10 MG/ML IV BOLUS
INTRAVENOUS | Status: DC | PRN
  Administered 2022-07-27: 160 mg via INTRAVENOUS

## 2022-07-27 MED ORDER — HYDROMORPHONE HCL 1 MG/ML IJ SOLN
INTRAMUSCULAR | Status: AC
  Administered 2022-07-27: 0.25 mg via INTRAVENOUS
  Filled 2022-07-27: qty 1

## 2022-07-27 MED ORDER — FENTANYL CITRATE (PF) 100 MCG/2ML IJ SOLN
INTRAMUSCULAR | Status: AC
  Filled 2022-07-27: qty 2

## 2022-07-27 MED ORDER — GABAPENTIN 100 MG PO CAPS
300.0000 mg | ORAL_CAPSULE | Freq: Two times a day (BID) | ORAL | Status: DC
  Administered 2022-07-27 – 2022-07-31 (×9): 300 mg via ORAL
  Filled 2022-07-27 (×9): qty 3

## 2022-07-27 MED ORDER — ENSURE PRE-SURGERY PO LIQD
296.0000 mL | Freq: Once | ORAL | Status: DC
  Filled 2022-07-27: qty 296

## 2022-07-27 MED ORDER — HEPARIN SODIUM (PORCINE) 1000 UNIT/ML IJ SOLN
INTRAMUSCULAR | Status: AC
  Filled 2022-07-27: qty 1

## 2022-07-27 MED ORDER — ALUM & MAG HYDROXIDE-SIMETH 200-200-20 MG/5ML PO SUSP: 30.0000 mL | Freq: Four times a day (QID) | ORAL | Status: DC | PRN

## 2022-07-27 MED ORDER — ONDANSETRON HCL 4 MG/2ML IJ SOLN: 4.0000 mg | Freq: Once | INTRAMUSCULAR | Status: DC | PRN

## 2022-07-27 MED ORDER — LIDOCAINE HCL 2 % IJ SOLN
INTRAMUSCULAR | Status: AC
  Filled 2022-07-27: qty 20

## 2022-07-27 MED ORDER — 0.9 % SODIUM CHLORIDE (POUR BTL) OPTIME
TOPICAL | Status: DC | PRN
  Administered 2022-07-27: 2000 mL

## 2022-07-27 MED ORDER — ACETAMINOPHEN 500 MG PO TABS
1000.0000 mg | ORAL_TABLET | ORAL | Status: AC
  Administered 2022-07-27: 1000 mg via ORAL
  Filled 2022-07-27: qty 2

## 2022-07-27 MED ORDER — MIDAZOLAM HCL 2 MG/2ML IJ SOLN
INTRAMUSCULAR | Status: AC
  Filled 2022-07-27: qty 2

## 2022-07-27 MED ORDER — POLYETHYLENE GLYCOL 3350 17 GM/SCOOP PO POWD: 1.0000 | Freq: Once | ORAL | Status: DC

## 2022-07-27 MED ORDER — OXYCODONE HCL 5 MG PO TABS: 5.0000 mg | ORAL_TABLET | Freq: Once | ORAL | Status: DC | PRN

## 2022-07-27 MED ORDER — KETOROLAC TROMETHAMINE 30 MG/ML IJ SOLN: 30.0000 mg | Freq: Once | INTRAMUSCULAR | Status: DC | PRN

## 2022-07-27 MED ORDER — BUPIVACAINE LIPOSOME 1.3 % IJ SUSP: 20.0000 mL | Freq: Once | INTRAMUSCULAR | Status: DC

## 2022-07-27 MED ORDER — DIPHENHYDRAMINE HCL 12.5 MG/5ML PO ELIX: 12.5000 mg | ORAL_SOLUTION | Freq: Four times a day (QID) | ORAL | Status: DC | PRN

## 2022-07-27 MED ORDER — GABAPENTIN 300 MG PO CAPS
300.0000 mg | ORAL_CAPSULE | ORAL | Status: AC
  Administered 2022-07-27: 300 mg via ORAL
  Filled 2022-07-27: qty 1

## 2022-07-27 MED ORDER — PHENYLEPHRINE HCL (PRESSORS) 10 MG/ML IV SOLN
INTRAVENOUS | Status: AC
  Filled 2022-07-27: qty 1

## 2022-07-27 MED ORDER — OXYCODONE HCL 5 MG/5ML PO SOLN: 5.0000 mg | Freq: Once | ORAL | Status: DC | PRN

## 2022-07-27 MED ORDER — ONDANSETRON HCL 4 MG/2ML IJ SOLN: 4.0000 mg | Freq: Four times a day (QID) | INTRAMUSCULAR | Status: DC | PRN

## 2022-07-27 MED ORDER — LIDOCAINE 2% (20 MG/ML) 5 ML SYRINGE
INTRAMUSCULAR | Status: DC | PRN
  Administered 2022-07-27: 1.5 mg/kg/h via INTRAVENOUS
  Administered 2022-07-27: 100 mg via INTRAVENOUS

## 2022-07-27 MED ORDER — DEXAMETHASONE SODIUM PHOSPHATE 10 MG/ML IJ SOLN
INTRAMUSCULAR | Status: DC | PRN
  Administered 2022-07-27: 10 mg via INTRAVENOUS

## 2022-07-27 MED ORDER — LACTATED RINGERS IV SOLN: INTRAVENOUS | Status: DC

## 2022-07-27 MED ORDER — HEPARIN SODIUM (PORCINE) 5000 UNIT/ML IJ SOLN
5000.0000 [IU] | Freq: Once | INTRAMUSCULAR | Status: AC
  Administered 2022-07-27: 5000 [IU] via SUBCUTANEOUS

## 2022-07-27 MED ORDER — PHENYLEPHRINE 80 MCG/ML (10ML) SYRINGE FOR IV PUSH (FOR BLOOD PRESSURE SUPPORT)
PREFILLED_SYRINGE | INTRAVENOUS | Status: AC
  Filled 2022-07-27: qty 10

## 2022-07-27 MED ORDER — LACTATED RINGERS IR SOLN
Status: DC | PRN
  Administered 2022-07-27: 1000 mL

## 2022-07-27 SURGICAL SUPPLY — 93 items
BAG COUNTER SPONGE SURGICOUNT (BAG) ×1 IMPLANT
BLADE EXTENDED COATED 6.5IN (ELECTRODE) IMPLANT
CANNULA REDUC XI 12-8 STAPL (CANNULA)
CANNULA REDUCER 12-8 DVNC XI (CANNULA) IMPLANT
CELLS DAT CNTRL 66122 CELL SVR (MISCELLANEOUS) IMPLANT
COVER SURGICAL LIGHT HANDLE (MISCELLANEOUS) ×2 IMPLANT
COVER TIP SHEARS 8 DVNC (MISCELLANEOUS) ×1 IMPLANT
COVER TIP SHEARS 8MM DA VINCI (MISCELLANEOUS) ×1
DRAIN CHANNEL 19F RND (DRAIN) IMPLANT
DRAPE ARM DVNC X/XI (DISPOSABLE) ×4 IMPLANT
DRAPE COLUMN DVNC XI (DISPOSABLE) ×1 IMPLANT
DRAPE DA VINCI XI ARM (DISPOSABLE) ×4
DRAPE DA VINCI XI COLUMN (DISPOSABLE) ×1
DRAPE SURG IRRIG POUCH 19X23 (DRAPES) ×1 IMPLANT
DRSG OPSITE POSTOP 4X10 (GAUZE/BANDAGES/DRESSINGS) IMPLANT
DRSG OPSITE POSTOP 4X6 (GAUZE/BANDAGES/DRESSINGS) IMPLANT
DRSG OPSITE POSTOP 4X8 (GAUZE/BANDAGES/DRESSINGS) IMPLANT
ELECT PENCIL ROCKER SW 15FT (MISCELLANEOUS) ×1 IMPLANT
ELECT REM PT RETURN 15FT ADLT (MISCELLANEOUS) ×1 IMPLANT
ENDOLOOP SUT PDS II  0 18 (SUTURE)
ENDOLOOP SUT PDS II 0 18 (SUTURE) IMPLANT
EVACUATOR SILICONE 100CC (DRAIN) IMPLANT
GLOVE BIO SURGEON STRL SZ 6.5 (GLOVE) ×3 IMPLANT
GLOVE BIOGEL PI IND STRL 7.0 (GLOVE) ×2 IMPLANT
GLOVE INDICATOR 6.5 STRL GRN (GLOVE) ×1 IMPLANT
GOWN SRG XL LVL 4 BRTHBL STRL (GOWNS) ×1 IMPLANT
GOWN STRL NON-REIN XL LVL4 (GOWNS) ×1
GOWN STRL REUS W/ TWL XL LVL3 (GOWN DISPOSABLE) ×3 IMPLANT
GOWN STRL REUS W/TWL XL LVL3 (GOWN DISPOSABLE) ×3
GRASPER SUT TROCAR 14GX15 (MISCELLANEOUS) IMPLANT
HOLDER FOLEY CATH W/STRAP (MISCELLANEOUS) ×1 IMPLANT
IRRIG SUCT STRYKERFLOW 2 WTIP (MISCELLANEOUS) ×1
IRRIGATION SUCT STRKRFLW 2 WTP (MISCELLANEOUS) ×1 IMPLANT
KIT PROCEDURE DA VINCI SI (MISCELLANEOUS)
KIT PROCEDURE DVNC SI (MISCELLANEOUS) IMPLANT
KIT TURNOVER KIT A (KITS) IMPLANT
NDL INSUFFLATION 14GA 120MM (NEEDLE) ×1 IMPLANT
NEEDLE INSUFFLATION 14GA 120MM (NEEDLE) ×1 IMPLANT
PACK CARDIOVASCULAR III (CUSTOM PROCEDURE TRAY) ×1 IMPLANT
PACK COLON (CUSTOM PROCEDURE TRAY) ×1 IMPLANT
PAD POSITIONING PINK XL (MISCELLANEOUS) ×1 IMPLANT
RELOAD STAPLE 60 3.5 BLU DVNC (STAPLE) IMPLANT
RELOAD STAPLE 60 4.3 GRN DVNC (STAPLE) IMPLANT
RELOAD STAPLER 3.5X60 BLU DVNC (STAPLE) IMPLANT
RELOAD STAPLER 4.3X60 GRN DVNC (STAPLE) ×1 IMPLANT
RETRACTOR WND ALEXIS 18 MED (MISCELLANEOUS) IMPLANT
RTRCTR WOUND ALEXIS 18CM MED (MISCELLANEOUS)
SCISSORS LAP 5X35 DISP (ENDOMECHANICALS) IMPLANT
SEAL CANN UNIV 5-8 DVNC XI (MISCELLANEOUS) ×3 IMPLANT
SEAL XI 5MM-8MM UNIVERSAL (MISCELLANEOUS) ×3
SEALER VESSEL DA VINCI XI (MISCELLANEOUS) ×1
SEALER VESSEL EXT DVNC XI (MISCELLANEOUS) ×1 IMPLANT
SOLUTION ELECTROLUBE (MISCELLANEOUS) ×1 IMPLANT
SPIKE FLUID TRANSFER (MISCELLANEOUS) IMPLANT
STAPLER 60 DA VINCI SURE FORM (STAPLE)
STAPLER 60 SUREFORM DVNC (STAPLE) IMPLANT
STAPLER CANNULA SEAL DVNC XI (STAPLE) IMPLANT
STAPLER CANNULA SEAL XI (STAPLE)
STAPLER ECHELON POWER CIR 29 (STAPLE) IMPLANT
STAPLER ECHELON POWER CIR 31 (STAPLE) IMPLANT
STAPLER RELOAD 3.5X60 BLU DVNC (STAPLE)
STAPLER RELOAD 3.5X60 BLUE (STAPLE)
STAPLER RELOAD 4.3X60 GREEN (STAPLE) ×1
STAPLER RELOAD 4.3X60 GRN DVNC (STAPLE) ×1
STOPCOCK 4 WAY LG BORE MALE ST (IV SETS) ×2 IMPLANT
SUT ETHILON 2 0 PS N (SUTURE) IMPLANT
SUT NOVA NAB GS-21 1 T12 (SUTURE) ×2 IMPLANT
SUT PROLENE 2 0 KS (SUTURE) IMPLANT
SUT SILK 2 0 (SUTURE) ×1
SUT SILK 2 0 SH CR/8 (SUTURE) IMPLANT
SUT SILK 2-0 18XBRD TIE 12 (SUTURE) ×1 IMPLANT
SUT SILK 3 0 (SUTURE)
SUT SILK 3 0 SH CR/8 (SUTURE) ×1 IMPLANT
SUT SILK 3-0 18XBRD TIE 12 (SUTURE) IMPLANT
SUT V-LOC BARB 180 2/0GR6 GS22 (SUTURE)
SUT VIC AB 2-0 SH 18 (SUTURE) IMPLANT
SUT VIC AB 2-0 SH 27 (SUTURE)
SUT VIC AB 2-0 SH 27X BRD (SUTURE) IMPLANT
SUT VIC AB 3-0 SH 18 (SUTURE) IMPLANT
SUT VIC AB 4-0 PS2 27 (SUTURE) ×2 IMPLANT
SUT VICRYL 0 UR6 27IN ABS (SUTURE) ×1 IMPLANT
SUTURE V-LC BRB 180 2/0GR6GS22 (SUTURE) IMPLANT
SYR 20ML ECCENTRIC (SYRINGE) ×1 IMPLANT
SYS LAPSCP GELPORT 120MM (MISCELLANEOUS)
SYS WOUND ALEXIS 18CM MED (MISCELLANEOUS)
SYSTEM LAPSCP GELPORT 120MM (MISCELLANEOUS) IMPLANT
SYSTEM WOUND ALEXIS 18CM MED (MISCELLANEOUS) IMPLANT
TOWEL OR 17X26 10 PK STRL BLUE (TOWEL DISPOSABLE) IMPLANT
TOWEL OR NON WOVEN STRL DISP B (DISPOSABLE) ×1 IMPLANT
TRAY FOLEY MTR SLVR 16FR STAT (SET/KITS/TRAYS/PACK) ×1 IMPLANT
TROCAR ADV FIXATION 5X100MM (TROCAR) ×1 IMPLANT
TUBING CONNECTING 10 (TUBING) ×2 IMPLANT
TUBING INSUFFLATION 10FT LAP (TUBING) ×1 IMPLANT

## 2022-07-27 NOTE — Progress Notes (Addendum)
Bleeding noted at midline incision while pt was attempting to use bathroom. MD notified. Dressing soiled in blood. Removed and pressure applied. New dressing applied. Pt denies pain. No signs of distress. Will monitor.

## 2022-07-27 NOTE — H&P (Signed)
PROVIDER:  Monico Blitz, MD   MRN: [id number] DOB: 12-Dec-1950  Interval History:    Tyler Wise is a 72 y.o. male who is seen today for postoperative visit after surgery for colon cancer.   Patient underwent a CT scan of the abdomen pelvis due to nausea vomiting and diarrhea associated with weight loss earlier this year.  This showed a mass in the right colon.  He was sent for colonoscopy.  Biopsies showed adenocarcinoma.  Mass was located in the ascending colon and tattooed distally.  There appears to also be a 5 cm polyp in the descending colon approximately 50 cm from the anal verge.  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  This was tattooed proximally and distally.  Patient also had a umbilical hernia on CT scan.  CT chest was neg.  He underwent a laparoscopic assisted right colectomy and primary hernia repair in Aug 22.           Past Medical History:  Diagnosis Date   Anemia     Colon cancer ([place]) 01/2021   Hyperlipidemia     Hypertension     Pre-diabetes           Past Surgical History:  Procedure Laterality Date   LAPAROSCOPIC PARTIAL COLECTOMY N/A 03/10/2021    Procedure: LAPAROSCOPIC RIGHT PARTIAL COLECTOMY WITH A PRIMARY UMBILICAL HERNIA REPAIR;  Surgeon: Leighton Ruff, MD;  Location: [facility];  Service: General;  Laterality: N/A;    Social History         Socioeconomic History   Marital status: Single      Spouse name: Not on file   Number of children: Not on file   Years of education: Not on file   Highest education level: Not on file  Occupational History   Not on file  Tobacco Use   Smoking status: Former      Packs/day: 1.50      Years: 3.00      Total pack years: 4.50      Types: Cigarettes      Quit date: 12/24/1974      Years since quitting: 47.4   Smokeless tobacco: Never  Vaping Use   Vaping Use: Never used  Substance and Sexual Activity   Alcohol use: Not Currently   Drug use: Never   Sexual activity:  Not on file  Other Topics Concern   Not on file  Social History Narrative   Not on file    Social Determinants of Health    Financial Resource Strain: Not on file  Food Insecurity: Not on file  Transportation Needs: Not on file  Physical Activity: Not on file  Stress: Not on file  Social Connections: Not on file  Intimate Partner Violence: Not on file    No family history on file. Review of Systems - Negative except as stated above     Current Outpatient Medications:    amLODipine (NORVASC) 10 MG tablet, TAKE 1 TABLET EVERY DAY, Disp: 90 tablet, Rfl: 1   aspirin EC 81 MG tablet, Take 81 mg by mouth daily. Swallow whole., Disp: , Rfl:    atorvastatin (LIPITOR) 10 MG tablet, Take 10 mg by mouth daily., Disp: , Rfl:    metFORMIN (GLUCOPHAGE) 500 MG tablet, Take 1 tablet (500 mg total) by mouth 2 (two) times daily with a meal., Disp: 180 tablet, Rfl: 3   tamsulosin (FLOMAX) 0.4 MG CAPS capsule, TAKE 2 CAPSULES EVERY DAY, Disp: 180 capsule, Rfl:  1   No Known Allergies   Physical Examination:    Gen: NAD Abd: soft, incisional hernia, reducible   Assessment and Plan:    Tyler Wise is a 72 y.o. male who underwent laparoscopic right colectomy and hernia repair for right-sided colon cancer in 2022.  Final pathology showed a T3N0 adenocarcinoma.  He is here today to discuss resecting his descending colon polyp.  We discussed this in detail.  All questions were answered.  He would like to wait until after the new year to have surgery.   The surgery and anatomy were described to the patient as well as the risks of surgery and the possible complications.  These include: Bleeding, deep abdominal infections and possible wound complications such as hernia and infection, damage to adjacent structures, leak of surgical connections, which can lead to other surgeries and possibly an ostomy, possible need for other procedures, such as abscess drains in radiology, possible prolonged hospital  stay, possible diarrhea from removal of part of the colon, possible constipation from narcotics, possible bowel, bladder or sexual dysfunction if having rectal surgery, prolonged fatigue/weakness or appetite loss, possible early recurrence of of disease, possible complications of their medical problems such as heart disease or arrhythmias or lung problems, death (less than 1%). I believe the patient understands and wishes to proceed with the surgery.      The plan was discussed in detail with the patient today, who expressed understanding.  The patient has my contact information, and understands to call me with any additional questions or concerns in the interval.  I would be happy to see the patient back sooner if the need arises.    Rosario Adie, MD Colon and Rectal Surgery [facility]

## 2022-07-27 NOTE — Op Note (Signed)
07/27/2022  11:30 AM  PATIENT:  Tyler Wise  72 y.o. male  Patient Care Team: Ladell Pier, MD as PCP - General (Internal Medicine)  PRE-OPERATIVE DIAGNOSIS:  COLON POLYP  POST-OPERATIVE DIAGNOSIS:  COLON POLYP  PROCEDURE: XI ROBOT ASSISTED LEFT COLECTOMY, INTRAOPERATIVE ASSESSMENT OF VASCULAR PERFUSION PRIMARY INCISIONAL HERNIA REPAIR   Surgeon(s): Leighton Ruff, MD Ileana Roup, MD  ASSISTANT: Dr Dema Severin   ANESTHESIA:   local and general  EBL: 42m Total I/O In: 900 [I.V.:900] Out: 525 [Urine:500; Blood:25]  Delay start of Pharmacological VTE agent (>24hrs) due to surgical blood loss or risk of bleeding:  no  DRAINS: none   SPECIMEN:  Source of Specimen:  Descending colon  DISPOSITION OF SPECIMEN:  PATHOLOGY  COUNTS:  YES  PLAN OF CARE: Admit to inpatient   PATIENT DISPOSITION:  PACU - hemodynamically stable.  INDICATION:    72y.o. M who underwent a laparoscopic right colectomy for a large right colon cancer.  at that point he was noted to have a endoscopically unresectable left-sided polyp.  we decided to perform this as a staged procedure.  he is here today for the second stage.  I recommended segmental resection:  The anatomy & physiology of the digestive tract was discussed.  The pathophysiology was discussed.  Natural history risks without surgery was discussed.   I worked to give an overview of the disease and the frequent need to have multispecialty involvement.  I feel the risks of no intervention will lead to serious problems that outweigh the operative risks; therefore, I recommended a partial colectomy to remove the pathology.  Laparoscopic & open techniques were discussed.   Risks such as bleeding, infection, abscess, leak, reoperation, possible ostomy, hernia, heart attack, death, and other risks were discussed.  I noted a good likelihood this will help address the problem.   Goals of post-operative recovery were discussed as well.    The  patient expressed understanding & wished to proceed with surgery.  OR FINDINGS:   Patient had tattoo noted in descending colon and an omental mass that appeared to be fat necrosis.  No obvious metastatic disease on visceral parietal peritoneum or liver.  DESCRIPTION:   Informed consent was confirmed.  The patient underwent general anaesthesia without difficulty.  The patient was positioned appropriately.  VTE prevention in place.  The patient's abdomen was clipped, prepped, & draped in a sterile fashion.  Surgical timeout confirmed our plan.  The patient was positioned in reverse Trendelenburg.  Abdominal entry was gained using a varies needle in the LUQ.  Entry was clean.  I induced carbon dioxide insufflation.  An 816mrobotic port was placed in the RUQ.  Camera inspection revealed no injury.  Extra ports were carefully placed under direct laparoscopic visualization.  I laparoscopically reflected the greater omentum and the upper abdomen the small bowel in the upper abdomen. The patient was appropriately positioned and the robot was docked to the patient's left side.  Instruments were placed under direct visualization.  There was an omental nodule that was noted on his previous midline incision.  This was dissected free from the mentum using the robotic vessel sealer and secured for eventual extraction.   I mobilized the sigmoid colon off of the pelvic sidewall. The patient had a portion of his sigmoid colon in a left inguinal hernia. I spent ~45 min carefully dissecting the colon out of the hernia sac.  I then scored the base of peritoneum of the right side of the  mesentery of the left colon from the ligament of Treitz to the peritoneal reflection of the mid rectum.  The patient had quite a bit of mesenteric fat and colon redundancy.  I elevated the sigmoid mesentery and enetered into the retro-mesenteric plane. We were able to identify the left ureter and gonadal vessels. We kept those posterior  within the retroperitoneum and elevated the left colon mesentery off that. I did isolated IMA pedicle but did not ligate it yet.  I continued distally and got into the avascular plane posterior to the mesorectum. This allowed me to help mobilize the rectum as well by freeing the mesorectum off the sacrum.  I mobilized the peritoneal coverings towards the peritoneal reflection on both the right and left sides of the rectum.  I could see the right and left ureters and stayed away from them.    I skeletonized the inferior mesenteric artery pedicle.  I went down to its takeoff from the aorta.  After confirming the left ureter was out of the way, I went ahead and ligated the inferior mesenteric artery pedicle with bipolar robotic vessel sealer ~2cm above its takeoff from the aorta.  We ensured hemostasis. I skeletonized the mesorectum at the junction at the distal sigmoid using blunt dissection & bipolar robotic vessel sealer.  I mobilized the left colon in a lateral to medial fashion off the line of Toldt up towards the splenic flexure to ensure good mobilization of the left colon to reach into the pelvis.  I mobilized the splenic flexure as well to ensure a good reach.  I then injected firefly intravenously and checked for perfusion of the colon.  There was good color in the proximal and distal margins indicating adequate perfusion of the remaining colon.  At this point we divided the distal margin using a green load 60 mm robotic stapler.  The remaining mesentery was divided up to the level of the proximal margin using the robotic vessel sealer.  The robot was then undocked.  His previous midline incision was then opened.  There was a 3 cm incisional hernia.  An Alexis wound protector was placed through this.  The colon was then brought out of the wound and divided proximally over a pursestring device.  A 2-0 Prolene pursestring was placed.  This was secured with 3-0 silk sutures.  A 29 mm EEA anvil was then  placed into the colon and the pursestring was tied tightly around this.  This was then placed back into the abdomen.  A cap was placed on the [place] wound protector.  A 29 mm EEA anastomosis was performed under direct laparoscopic visualization.  There was no tension noted on the anastomosis.  There was no leak when tested with insufflation under irrigation.  Irrigation was removed.  There was no sign of active bleeding or injury.  We then switched to clean gowns, gloves, instruments and drapes.  The fascia of the hernia was then closed using 2 running #1 Novafil sutures.  Subcutaneous tissue was reapproximated using a 2-0 Vicryl suture.  The skin was closed with a 4-0 Vicryl suture.  A sterile dressing was applied.  The remaining port sites were closed using a 4-0 Vicryl suture and Dermabond.  The patient was then awakened from anesthesia and sent to the postanesthesia care unit in stable condition.  All counts were correct per operating room staff.  An MD assistant was necessary for tissue manipulation, retraction and positioning due to the complexity of the case and hospital  policies   Rosario Adie, MD  [facility]

## 2022-07-27 NOTE — Anesthesia Procedure Notes (Signed)
Procedure Name: Intubation Date/Time: 07/27/2022 8:56 AM  Performed by: Lavina Hamman, CRNAPre-anesthesia Checklist: Patient identified, Emergency Drugs available, Suction available, Patient being monitored and Timeout performed Patient Re-evaluated:Patient Re-evaluated prior to induction Oxygen Delivery Method: Circle system utilized Preoxygenation: Pre-oxygenation with 100% oxygen Induction Type: IV induction Ventilation: Mask ventilation without difficulty Laryngoscope Size: Mac and 4 Grade View: Grade II Tube type: Oral Tube size: 7.5 mm Number of attempts: 1 Airway Equipment and Method: Stylet Placement Confirmation: ETT inserted through vocal cords under direct vision, positive ETCO2, CO2 detector and breath sounds checked- equal and bilateral Secured at: 22 cm Tube secured with: Tape Dental Injury: Teeth and Oropharynx as per pre-operative assessment  Comments: ATOI

## 2022-07-27 NOTE — Transfer of Care (Signed)
Immediate Anesthesia Transfer of Care Note  Patient: Tyler Wise  Procedure(s) Performed: Procedure(s): XI ROBOT ASSISTED LEFT COLECTOMY (N/A)  Patient Location: PACU  Anesthesia Type:General  Level of Consciousness:  sedated, patient cooperative and responds to stimulation  Airway & Oxygen Therapy:Patient Spontanous Breathing and Patient connected to face mask oxgen  Post-op Assessment:  Report given to PACU RN and Post -op Vital signs reviewed and stable  Post vital signs:  Reviewed and stable  Last Vitals:  Vitals:   07/27/22 0649  BP: (!) 146/89  Pulse: 86  Resp: 17  Temp: 36.6 C  SpO2: 55%    Complications: No apparent anesthesia complications

## 2022-07-27 NOTE — Anesthesia Postprocedure Evaluation (Signed)
Anesthesia Post Note  Patient: Tyler Wise  Procedure(s) Performed: XI ROBOT ASSISTED LEFT COLECTOMY (Abdomen)     Patient location during evaluation: PACU Anesthesia Type: General Level of consciousness: awake and alert Pain management: pain level controlled Vital Signs Assessment: post-procedure vital signs reviewed and stable Respiratory status: spontaneous breathing, nonlabored ventilation, respiratory function stable and patient connected to nasal cannula oxygen Cardiovascular status: blood pressure returned to baseline and stable Postop Assessment: no apparent nausea or vomiting Anesthetic complications: no  No notable events documented.  Last Vitals:  Vitals:   07/27/22 1348 07/27/22 1420  BP:  (!) 145/87  Pulse:  81  Resp:  17  Temp: (!) 36.3 C 36.6 C  SpO2:  98%    Last Pain:  Vitals:   07/27/22 1420  TempSrc: Oral  PainSc:                  [name]

## 2022-07-28 LAB — CBC
HCT: 41.9 % (ref 39.0–52.0)
Hemoglobin: 13.5 g/dL (ref 13.0–17.0)
MCH: 24.6 pg — ABNORMAL LOW (ref 26.0–34.0)
MCHC: 32.2 g/dL (ref 30.0–36.0)
MCV: 76.5 fL — ABNORMAL LOW (ref 80.0–100.0)
Platelets: 299 10*3/uL (ref 150–400)
RBC: 5.48 MIL/uL (ref 4.22–5.81)
RDW: 17.6 % — ABNORMAL HIGH (ref 11.5–15.5)
WBC: 15.9 10*3/uL — ABNORMAL HIGH (ref 4.0–10.5)
nRBC: 0 % (ref 0.0–0.2)

## 2022-07-28 LAB — BASIC METABOLIC PANEL
Anion gap: 8 (ref 5–15)
BUN: 11 mg/dL (ref 8–23)
CO2: 22 mmol/L (ref 22–32)
Calcium: 8.3 mg/dL — ABNORMAL LOW (ref 8.9–10.3)
Chloride: 107 mmol/L (ref 98–111)
Creatinine, Ser: 1.09 mg/dL (ref 0.61–1.24)
GFR, Estimated: >60 mL/min (ref >60)
Glucose, Bld: 134 mg/dL — ABNORMAL HIGH (ref 70–99)
Potassium: 3.5 mmol/L (ref 3.5–5.1)
Sodium: 137 mmol/L (ref 135–145)

## 2022-07-28 NOTE — Progress Notes (Signed)
1 Day Post-Op Robotic Sigmoidectomy  Subjective: Tolerating fulls, ambulating, passing flatus, pain controlled.  Had some trouble with incisional bleeding yesterday afternoon but has since stopped  Objective: Vital signs in last 24 hours: Temp:  [97.3 F (36.3 C)-98.4 F (36.9 C)] 97.8 F (36.6 C) (01/04 0941) Pulse Rate:  [69-100] 82 (01/04 0941) Resp:  [13-19] 16 (01/04 0941) BP: (102-158)/(76-91) 148/86 (01/04 0941) SpO2:  [93 %-100 %] 97 % (01/04 0941) Weight:  [081 kg] 118 kg (01/04 0421)   Intake/Output from previous day: 01/03 0701 - 01/04 0700 In: 3850.4 [P.O.:1200; I.V.:2650.4] Out: 2500 [Urine:2475; Blood:25] Intake/Output this shift: Total I/O In: 360 [P.O.:360] Out: 300 [Urine:300]   General appearance: alert and cooperative GI: normal findings: soft, non-tender  Incision: no significant drainage  Lab Results:  Recent Labs    07/28/22 0456  WBC 15.9*  HGB 13.5  HCT 41.9  PLT 299   BMET Recent Labs    07/28/22 0456  NA 137  K 3.5  CL 107  CO2 22  GLUCOSE 134*  BUN 11  CREATININE 1.09  CALCIUM 8.3*   PT/INR No results for input(s): "LABPROT", "INR" in the last 72 hours. ABG No results for input(s): "PHART", "HCO3" in the last 72 hours.  Invalid input(s): "PCO2", "PO2"  MEDS, Scheduled  acetaminophen  1,000 mg Oral Q6H   enoxaparin (LOVENOX) injection  40 mg Subcutaneous Q24H   feeding supplement  237 mL Oral BID BM   gabapentin  300 mg Oral BID   saccharomyces boulardii  250 mg Oral BID    Studies/Results: No results found.  Assessment: s/p Procedure(s): XI ROBOT ASSISTED LEFT COLECTOMY Patient Active Problem List   Diagnosis Date Noted   Colon polyp 07/27/2022   Trigger thumb, right thumb 10/05/2021   Essential hypertension 05/07/2021   Hyperlipidemia 05/07/2021   Obesity (BMI 30.0-34.9) 05/07/2021   Prediabetes 05/07/2021   Iron deficiency anemia due to chronic blood loss 05/07/2021   History of colon cancer 05/07/2021     Expected post op course  Plan: d/c foley Advance diet Plan for discharge tomorrow   LOS: 1 day     .Rosario Adie, MD [facility], [place]    07/28/2022 9:58 AM

## 2022-07-28 NOTE — TOC CM/SW Note (Signed)
Transition of Care [facility]) Screening Note  Patient Details  Name: Tyler Wise Date of Birth: 05/13/1951  Transition of Care [facility]) CM/SW Contact:    Sherie Don, LCSW Phone Number: 07/28/2022, 8:54 AM  Transition of Care Department [facility]) has reviewed patient and no TOC needs have been identified at this time. We will continue to monitor patient advancement through interdisciplinary progression rounds. If new patient transition needs arise, please place a TOC consult.

## 2022-07-29 LAB — SURGICAL PATHOLOGY

## 2022-07-29 NOTE — Progress Notes (Signed)
2 Days Post-Op Robotic Sigmoidectomy  Subjective: Feels bloated ambulating, passing some flatus, pain controlled.   Objective: Vital signs in last 24 hours: Temp:  [97.8 F (36.6 C)-98.7 F (37.1 C)] 98.5 F (36.9 C) (01/05 0634) Pulse Rate:  [73-82] 75 (01/05 0634) Resp:  [16-18] 16 (01/05 0634) BP: (133-148)/(82-90) 146/82 (01/05 0634) SpO2:  [96 %-97 %] 96 % (01/05 0634)   Intake/Output from previous day: 01/04 0701 - 01/05 0700 In: 1680 [P.O.:1680] Out: 1200 [Urine:1200] Intake/Output this shift: No intake/output data recorded.   General appearance: alert and cooperative GI: normal findings: soft, non-tender  Incision: no significant drainage  Lab Results:  Recent Labs    07/28/22 0456  WBC 15.9*  HGB 13.5  HCT 41.9  PLT 299    BMET Recent Labs    07/28/22 0456  NA 137  K 3.5  CL 107  CO2 22  GLUCOSE 134*  BUN 11  CREATININE 1.09  CALCIUM 8.3*    PT/INR No results for input(s): "LABPROT", "INR" in the last 72 hours. ABG No results for input(s): "PHART", "HCO3" in the last 72 hours.  Invalid input(s): "PCO2", "PO2"  MEDS, Scheduled  acetaminophen  1,000 mg Oral Q6H   enoxaparin (LOVENOX) injection  40 mg Subcutaneous Q24H   feeding supplement  237 mL Oral BID BM   gabapentin  300 mg Oral BID   saccharomyces boulardii  250 mg Oral BID    Studies/Results: No results found.  Assessment: s/p Procedure(s): XI ROBOT ASSISTED LEFT COLECTOMY Patient Active Problem List   Diagnosis Date Noted   Colon polyp 07/27/2022   Trigger thumb, right thumb 10/05/2021   Essential hypertension 05/07/2021   Hyperlipidemia 05/07/2021   Obesity (BMI 30.0-34.9) 05/07/2021   Prediabetes 05/07/2021   Iron deficiency anemia due to chronic blood loss 05/07/2021   History of colon cancer 05/07/2021    Expected post op course  Plan:  FLD diet today until better bowel function    LOS: 2 days     .Rosario Adie, MD [facility],  [place]    07/29/2022 7:49 AM

## 2022-07-29 NOTE — Progress Notes (Signed)
Mobility Specialist - Progress Note   07/29/22 1315  Mobility  Activity Ambulated independently in hallway  Level of Assistance Contact guard assist, steadying assist  Distance Ambulated (ft) 500 ft  Activity Response Tolerated well  Mobility Referral Yes  $Mobility charge 1 Mobility   Pt received in bathroom and agreeable to mobility. C/o feeling constipated, but pt stated MD was aware. Pt to bed after session with all needs met.    [facility]

## 2022-07-30 LAB — CBC
HCT: 43.4 % (ref 39.0–52.0)
Hemoglobin: 14.1 g/dL (ref 13.0–17.0)
MCH: 24.7 pg — ABNORMAL LOW (ref 26.0–34.0)
MCHC: 32.5 g/dL (ref 30.0–36.0)
MCV: 75.9 fL — ABNORMAL LOW (ref 80.0–100.0)
Platelets: 281 10*3/uL (ref 150–400)
RBC: 5.72 MIL/uL (ref 4.22–5.81)
RDW: 18.5 % — ABNORMAL HIGH (ref 11.5–15.5)
WBC: 13.5 10*3/uL — ABNORMAL HIGH (ref 4.0–10.5)
nRBC: 0 % (ref 0.0–0.2)

## 2022-07-30 LAB — BASIC METABOLIC PANEL
Anion gap: 10 (ref 5–15)
BUN: 8 mg/dL (ref 8–23)
CO2: 25 mmol/L (ref 22–32)
Calcium: 8.6 mg/dL — ABNORMAL LOW (ref 8.9–10.3)
Chloride: 103 mmol/L (ref 98–111)
Creatinine, Ser: 0.98 mg/dL (ref 0.61–1.24)
GFR, Estimated: >60 mL/min (ref >60)
Glucose, Bld: 98 mg/dL (ref 70–99)
Potassium: 3.3 mmol/L — ABNORMAL LOW (ref 3.5–5.1)
Sodium: 138 mmol/L (ref 135–145)

## 2022-07-30 NOTE — Progress Notes (Signed)
3 Days Post-Op Robotic Sigmoidectomy  Subjective: Feels less bloated, ambulating, passing some flatus, pain controlled.   Objective: Vital signs in last 24 hours: Temp:  [98.1 F (36.7 C)-98.9 F (37.2 C)] 98.4 F (36.9 C) (01/06 0512) Pulse Rate:  [63-69] 63 (01/06 0512) Resp:  [16-18] 18 (01/06 0512) BP: (127-152)/(87-88) 152/87 (01/06 0512) SpO2:  [95 %-98 %] 97 % (01/06 0512)   Intake/Output from previous day: 01/05 0701 - 01/06 0700 In: 1840 [P.O.:1840] Out: -  Intake/Output this shift: No intake/output data recorded.   General appearance: alert and cooperative GI: normal findings: soft, non-tender  Incision: no significant drainage  Lab Results:  Recent Labs    07/28/22 0456 07/30/22 0520  WBC 15.9* 13.5*  HGB 13.5 14.1  HCT 41.9 43.4  PLT 299 281    BMET Recent Labs    07/28/22 0456 07/30/22 0520  NA 137 138  K 3.5 3.3*  CL 107 103  CO2 22 25  GLUCOSE 134* 98  BUN 11 8  CREATININE 1.09 0.98  CALCIUM 8.3* 8.6*    PT/INR No results for input(s): "LABPROT", "INR" in the last 72 hours. ABG No results for input(s): "PHART", "HCO3" in the last 72 hours.  Invalid input(s): "PCO2", "PO2"  MEDS, Scheduled  acetaminophen  1,000 mg Oral Q6H   enoxaparin (LOVENOX) injection  40 mg Subcutaneous Q24H   feeding supplement  237 mL Oral BID BM   gabapentin  300 mg Oral BID   saccharomyces boulardii  250 mg Oral BID    Studies/Results: No results found.  Assessment: s/p Procedure(s): XI ROBOT ASSISTED LEFT COLECTOMY Patient Active Problem List   Diagnosis Date Noted   Colon polyp 07/27/2022   Trigger thumb, right thumb 10/05/2021   Essential hypertension 05/07/2021   Hyperlipidemia 05/07/2021   Obesity (BMI 30.0-34.9) 05/07/2021   Prediabetes 05/07/2021   Iron deficiency anemia due to chronic blood loss 05/07/2021   History of colon cancer 05/07/2021    Expected post op course  Plan: Advance to reg diet    LOS: 3 days     .Rosario Adie, MD [facility], [place]    07/30/2022 9:04 AM

## 2022-07-30 NOTE — Progress Notes (Signed)
Mobility Specialist - Progress Note   07/30/22 1204  Mobility  Activity Ambulated independently in hallway  Level of Assistance Independent  Assistive Device None  Distance Ambulated (ft) 600 ft  Range of Motion/Exercises Active  Activity Response Tolerated well  Mobility Referral Yes  $Mobility charge 1 Mobility   Pt received in bathroom standing and agreeable to mobility. No complaints during mobility session. Pt to bed after session with all needs met.    [facility]

## 2022-07-31 MED ORDER — TRAMADOL HCL 50 MG PO TABS: 50.0000 mg | ORAL_TABLET | Freq: Four times a day (QID) | ORAL | 0 refills | Status: DC | PRN

## 2022-07-31 NOTE — Plan of Care (Signed)
Patient is stable for discharge. Discharge instructions have been given. All questions were answered. Patient is discharged to home with daughter.

## 2022-07-31 NOTE — Progress Notes (Signed)
Pt had a small amount of brownish, soft sausage like bowel movement this morning.

## 2022-07-31 NOTE — Discharge Summary (Signed)
Physician Discharge Summary  Patient ID: Tyler Wise MRN: [id number] DOB/AGE: November 06, 1950 72 y.o.  Admit date: 07/27/2022 Discharge date: 07/31/2022  Admission Diagnoses: Colon polyp Discharge Diagnoses:  Principal Problem:   Colon polyp   Discharged Condition: good  Hospital Course: Patient was admitted to the med surg floor after surgery.  Diet was advanced as tolerated.  Patient began to have bowel function on postop day 3.  By postop day 4, he was tolerating a solid diet and pain was controlled with oral medications.  He was urinating without difficulty and ambulating without assistance.  Patient was felt to be in stable condition for discharge to home.   Consults: None  Significant Diagnostic Studies: labs: cbc, bmet  Treatments: IV hydration, analgesia: acetaminophen, and surgery: Robotic Sigmoidectomy  Discharge Exam: Blood pressure (!) 148/98, pulse 65, temperature 97.8 F (36.6 C), temperature source Oral, resp. rate 18, height '5\' 9"'$  (1.753 m), weight 115 kg, SpO2 100 %. General appearance: alert and cooperative GI: soft, non-distended Incision/Wound: clean, dry, intact  Disposition: Discharge disposition: 01-Home or Self Care        Allergies as of 07/31/2022   No Known Allergies      Medication List     TAKE these medications    amLODipine 10 MG tablet Commonly known as: NORVASC TAKE 1 TABLET EVERY DAY   aspirin EC 81 MG tablet Take 81 mg by mouth daily. Swallow whole.   atorvastatin 20 MG tablet Commonly known as: LIPITOR Take 20 mg by mouth daily.   metFORMIN 500 MG tablet Commonly known as: GLUCOPHAGE Take 1 tablet (500 mg total) by mouth 2 (two) times daily with a meal.   tamsulosin 0.4 MG Caps capsule Commonly known as: FLOMAX TAKE 2 CAPSULES EVERY DAY   traMADol 50 MG tablet Commonly known as: ULTRAM Take 1-2 tablets (50-100 mg total) by mouth every 6 (six) hours as needed for moderate pain.        Follow-up Information      Leighton Ruff, MD. Schedule an appointment as soon as possible for a visit in 2 week(s).   Specialties: General Surgery, Colon and Rectal Surgery Contact information: [place] [address] [phone]                 Signed: Rosario Adie 01/23/946, 9:09 AM

## 2022-07-31 NOTE — Discharge Instructions (Signed)
SURGERY: POST OP INSTRUCTIONS (Surgery for small bowel obstruction, colon resection, etc)   ######################################################################  EAT Gradually transition to a high fiber diet with a fiber supplement over the next few days after discharge  WALK Walk an hour a day.  Control your pain to do that.    CONTROL PAIN Control pain so that you can walk, sleep, tolerate sneezing/coughing, go up/down stairs.  HAVE A BOWEL MOVEMENT DAILY Keep your bowels regular to avoid problems.  OK to try a laxative to override constipation.  OK to use an antidairrheal to slow down diarrhea.  Call if not better after 2 tries  CALL IF YOU HAVE PROBLEMS/CONCERNS Call if you are still struggling despite following these instructions. Call if you have concerns not answered by these instructions  ######################################################################   DIET Follow a light diet the first few days at home.  Start with a bland diet such as soups, liquids, starchy foods, low fat foods, etc.  If you feel full, bloated, or constipated, stay on a ful liquid or pureed/blenderized diet for a few days until you feel better and no longer constipated. Be sure to drink plenty of fluids every day to avoid getting dehydrated (feeling dizzy, not urinating, etc.). Gradually add a fiber supplement to your diet over the next week.  Gradually get back to a regular solid diet.  Avoid fast food or heavy meals the first week as you are more likely to get nauseated. It is expected for your digestive tract to need a few months to get back to normal.  It is common for your bowel movements and stools to be irregular.  You will have occasional bloating and cramping that should eventually fade away.  Until you are eating solid food normally, off all pain medications, and back to regular activities; your bowels will not be normal. Focus on eating a low-fat, high fiber diet the rest of your life  (See Getting to Good Bowel Health, below).  CARE of your INCISION or WOUND  It is good for closed incisions and even open wounds to be washed every day.  Shower every day.  Short baths are fine.  Wash the incisions and wounds clean with soap & water.    You may leave closed incisions open to air if it is dry.   You may cover the incision with clean gauze & replace it after your daily shower for comfort.  STAPLES: You have skin staples.  Leave them in place & set up an appointment for them to be removed by a surgery office nurse ~10 days after surgery. = 1st week of January 2024    ACTIVITIES as tolerated Start light daily activities --- self-care, walking, climbing stairs-- beginning the day after surgery.  Gradually increase activities as tolerated.  Control your pain to be active.  Stop when you are tired.  Ideally, walk several times a day, eventually an hour a day.   Most people are back to most day-to-day activities in a few weeks.  It takes 4-8 weeks to get back to unrestricted, intense activity. If you can walk 30 minutes without difficulty, it is safe to try more intense activity such as jogging, treadmill, bicycling, low-impact aerobics, swimming, etc. Save the most intensive and strenuous activity for last (Usually 4-8 weeks after surgery) such as sit-ups, heavy lifting, contact sports, etc.  Refrain from any intense heavy lifting or straining until you are off narcotics for pain control.  You will have off days, but things should improve   week-by-week. DO NOT PUSH THROUGH PAIN.  Let pain be your guide: If it hurts to do something, don't do it.  Pain is your body warning you to avoid that activity for another week until the pain goes down. You may drive when you are no longer taking narcotic prescription pain medication, you can comfortably wear a seatbelt, and you can safely make sudden turns/stops to protect yourself without hesitating due to pain. You may have sexual intercourse when it  is comfortable. If it hurts to do something, stop.  MEDICATIONS Take your usually prescribed home medications unless otherwise directed.   Blood thinners:  Usually you can restart any strong blood thinners after the second postoperative day.  It is OK to take aspirin right away.     If you are on strong blood thinners (warfarin/Coumadin, Plavix, Xerelto, Eliquis, Pradaxa, etc), discuss with your surgeon, medicine PCP, and/or cardiologist for instructions on when to restart the blood thinner & if blood monitoring is needed (PT/INR blood check, etc).     PAIN CONTROL Pain after surgery or related to activity is often due to strain/injury to muscle, tendon, nerves and/or incisions.  This pain is usually short-term and will improve in a few months.  To help speed the process of healing and to get back to regular activity more quickly, DO THE FOLLOWING THINGS TOGETHER: Increase activity gradually.  DO NOT PUSH THROUGH PAIN Use Ice and/or Heat Try Gentle Massage and/or Stretching Take over the counter pain medication Take Narcotic prescription pain medication for more severe pain  Good pain control = faster recovery.  It is better to take more medicine to be more active than to stay in bed all day to avoid medications.  Increase activity gradually Avoid heavy lifting at first, then increase to lifting as tolerated over the next 6 weeks. Do not "push through" the pain.  Listen to your body and avoid positions and maneuvers than reproduce the pain.  Wait a few days before trying something more intense Walking an hour a day is encouraged to help your body recover faster and more safely.  Start slowly and stop when getting sore.  If you can walk 30 minutes without stopping or pain, you can try more intense activity (running, jogging, aerobics, cycling, swimming, treadmill, sex, sports, weightlifting, etc.) Remember: If it hurts to do it, then don't do it! Use Ice and/or Heat You will have swelling and  bruising around the incisions.  This will take several weeks to resolve. Ice packs or heating pads (6-8 times a day, 30-60 minutes at a time) will help sooth soreness & bruising. Some people prefer to use ice alone, heat alone, or alternate between ice & heat.  Experiment and see what works best for you.  Consider trying ice for the first few days to help decrease swelling and bruising; then, switch to heat to help relax sore spots and speed recovery. Shower every day.  Short baths are fine.  It feels good!  Keep the incisions and wounds clean with soap & water.   Try Gentle Massage and/or Stretching Massage at the area of pain many times a day Stop if you feel pain - do not overdo it Take over the counter pain medication This helps the muscle and nerve tissues become less irritable and calm down faster Choose ONE of the following over-the-counter anti-inflammatory medications: Acetaminophen 500mg tabs (Tylenol) 1-2 pills with every meal and just before bedtime (avoid if you have liver problems or if you have   acetaminophen in you narcotic prescription) Naproxen 220mg tabs (ex. Aleve, Naprosyn) 1-2 pills twice a day (avoid if you have kidney, stomach, IBD, or bleeding problems) Ibuprofen 200mg tabs (ex. Advil, Motrin) 3-4 pills with every meal and just before bedtime (avoid if you have kidney, stomach, IBD, or bleeding problems) Take with food/snack several times a day as directed for at least 2 weeks to help keep pain / soreness down & more manageable. Take Narcotic prescription pain medication for more severe pain A prescription for strong pain control is often given to you upon discharge (for example: oxycodone/Percocet, hydrocodone/Norco/Vicodin, or tramadol/Ultram) Take your pain medication as prescribed. Be mindful that most narcotic prescriptions contain Tylenol (acetaminophen) as well - avoid taking too much Tylenol. If you are having problems/concerns with the prescription medicine (does  not control pain, nausea, vomiting, rash, itching, etc.), please call us [phone] to see if we need to switch you to a different pain medicine that will work better for you and/or control your side effects better. If you need a refill on your pain medication, you must call the office before 4 pm and on weekdays only.  By federal law, prescriptions for narcotics cannot be called into a pharmacy.  They must be filled out on paper & picked up from our office by the patient or authorized caretaker.  Prescriptions cannot be filled after 4 pm nor on weekends.    WHEN TO CALL US [phone] Severe uncontrolled or worsening pain  Fever over 101 F (38.5 C) Concerns with the incision: Worsening pain, redness, rash/hives, swelling, bleeding, or drainage Reactions / problems with new medications (itching, rash, hives, nausea, etc.) Nausea and/or vomiting Difficulty urinating Difficulty breathing Worsening fatigue, dizziness, lightheadedness, blurred vision Other concerns If you are not getting better after two weeks or are noticing you are getting worse, contact our office [phone] for further advice.  We may need to adjust your medications, re-evaluate you in the office, send you to the emergency room, or see what other things we can do to help. The clinic staff is available to answer your questions during regular business hours (8:30am-5pm).  Please don't hesitate to call and ask to speak to one of our nurses for clinical concerns.    A surgeon from [facility] is always on call at the hospitals 24 hours/day If you have a medical emergency, go to the nearest emergency room or call 911.  FOLLOW UP in our office One the day of your discharge from the hospital (or the next business weekday), please call [facility] to set up or confirm an appointment to see your surgeon in the office for a follow-up appointment.  Usually it is 2-3 weeks after your surgery.   If you  have skin staples at your incision(s), let the office know so we can set up a time in the office for the nurse to remove them (usually around 10 days after surgery). Make sure that you call for appointments the day of discharge (or the next business weekday) from the hospital to ensure a convenient appointment time. IF YOU HAVE DISABILITY OR FAMILY LEAVE FORMS, BRING THEM TO THE OFFICE FOR PROCESSING.  DO NOT GIVE THEM TO YOUR DOCTOR.  [facility] [address], [address], [place], [place]  [address] ? [phone] - Main [phone] - Toll Free,  [phone] - Fax [email or web address]    GETTING TO GOOD BOWEL HEALTH. It is expected for your digestive tract to   need a few months to get back to normal.  It is common for your bowel movements and stools to be irregular.  You will have occasional bloating and cramping that should eventually fade away.  Until you are eating solid food normally, off all pain medications, and back to regular activities; your bowels will not be normal.   Avoiding constipation The goal: ONE SOFT BOWEL MOVEMENT A DAY!    Drink plenty of fluids.  Choose water first. TAKE A FIBER SUPPLEMENT EVERY DAY THE REST OF YOUR LIFE During your first week back home, gradually add back a fiber supplement every day Experiment which form you can tolerate.   There are many forms such as powders, tablets, wafers, gummies, etc Psyllium bran (Metamucil), methylcellulose (Citrucel), Miralax or Glycolax, Benefiber, Flax Seed.  Adjust the dose week-by-week (1/2 dose/day to 6 doses a day) until you are moving your bowels 1-2 times a day.  Cut back the dose or try a different fiber product if it is giving you problems such as diarrhea or bloating. Sometimes a laxative is needed to help jump-start bowels if constipated until the fiber supplement can help regulate your bowels.  If you are tolerating eating & you are farting, it is okay to try a gentle  laxative such as double dose MiraLax, prune juice, or Milk of Magnesia.  Avoid using laxatives too often. Stool softeners can sometimes help counteract the constipating effects of narcotic pain medicines.  It can also cause diarrhea, so avoid using for too long. If you are still constipated despite taking fiber daily, eating solids, and a few doses of laxatives, call our office. Controlling diarrhea Try drinking liquids and eating bland foods for a few days to avoid stressing your intestines further. Avoid dairy products (especially milk & ice cream) for a short time.  The intestines often can lose the ability to digest lactose when stressed. Avoid foods that cause gassiness or bloating.  Typical foods include beans and other legumes, cabbage, broccoli, and dairy foods.  Avoid greasy, spicy, fast foods.  Every person has some sensitivity to other foods, so listen to your body and avoid those foods that trigger problems for you. Probiotics (such as active yogurt, Align, etc) may help repopulate the intestines and colon with normal bacteria and calm down a sensitive digestive tract Adding a fiber supplement gradually can help thicken stools by absorbing excess fluid and retrain the intestines to act more normally.  Slowly increase the dose over a few weeks.  Too much fiber too soon can backfire and cause cramping & bloating. It is okay to try and slow down diarrhea with a few doses of antidiarrheal medicines.   Bismuth subsalicylate (ex. Kayopectate, Pepto Bismol) for a few doses can help control diarrhea.  Avoid if pregnant.   Loperamide (Imodium) can slow down diarrhea.  Start with one tablet (2mg) first.  Avoid if you are having fevers or severe pain.  ILEOSTOMY PATIENTS WILL HAVE CHRONIC DIARRHEA since their colon is not in use.    Drink plenty of liquids.  You will need to drink even more glasses of water/liquid a day to avoid getting dehydrated. Record output from your ileostomy.  Expect to empty  the bag every 3-4 hours at first.  Most people with a permanent ileostomy empty their bag 4-6 times at the least.   Use antidiarrheal medicine (especially Imodium) several times a day to avoid getting dehydrated.  Start with a dose at bedtime & breakfast.  Adjust up or   down as needed.  Increase antidiarrheal medications as directed to avoid emptying the bag more than 8 times a day (every 3 hours). Work with your wound ostomy nurse to learn care for your ostomy.  See ostomy care instructions. TROUBLESHOOTING IRREGULAR BOWELS 1) Start with a soft & bland diet. No spicy, greasy, or fried foods.  2) Avoid gluten/wheat or dairy products from diet to see if symptoms improve. 3) Miralax 17gm or flax seed mixed in 8oz. water or juice-daily. May use 2-4 times a day as needed. 4) Gas-X, Phazyme, etc. as needed for gas & bloating.  5) Prilosec (omeprazole) over-the-counter as needed 6)  Consider probiotics (Align, Activa, etc) to help calm the bowels down  Call your doctor if you are getting worse or not getting better.  Sometimes further testing (cultures, endoscopy, X-ray studies, CT scans, bloodwork, etc.) may be needed to help diagnose and treat the cause of the diarrhea. [facility] [address], [address], [place], [place]  [address] [phone] - Main.    [phone]  - Toll Free.   [phone] - Fax [email or web address]   ###############################   #######################################################  Ostomy Support Information  You've heard that people get along just fine with only one of their eyes, or one of their lungs, or one of their kidneys. But you also know that you have only one intestine and only one bladder, and that leaves you feeling awfully empty, both physically and emotionally: You think no other people go around without part of their intestine with the ends of their intestines sticking out through their abdominal walls.    YOU ARE NOT ALONE.  There are nearly three quarters of a million people in the [place] who have an ostomy; people who have had surgery to remove all or part of their colons or bladders.   There is even a national association, the [employer] with over 350 local affiliated support groups that are organized by volunteers who provide peer support and counseling. [employer] has a toll free telephone num-ber, 800-826-0826 and an educational, interactive website, www.ostomy.org   An ostomy is an opening in the belly (abdominal wall) made by surgery. Ostomates are people who have had this procedure. The opening (stoma) allows the kidney or bowel to grdischarge waste. An external pouch covers the stoma to collect waste. Pouches are are a simple bag and are odor free. Different companies have disposable or reusable pouches to fit one's lifestyle. An ostomy can either be temporary or permanent.   THERE ARE THREE MAIN TYPES OF OSTOMIES Colostomy. A colostomy is a surgically created opening in the large intestine (colon). Ileostomy. An ileostomy is a surgically created opening in the small intestine. Urostomy. A urostomy is a surgically created opening to divert urine away from the bladder.  OSTOMY Care  The following guidelines will make care of your colostomy easier. Keep this information close by for quick reference.  Helpful DIET hints Eat a well-balanced diet including vegetables and fresh fruits. Eat on a regular schedule.  Drink at least 6 to 8 glasses of fluids daily. Eat slowly in a relaxed atmosphere. Chew your food thoroughly. Avoid chewing gum, smoking, and drinking from a straw. This will help decrease the amount of air you swallow, which may help reduce gas. Eating yogurt or drinking buttermilk may help reduce gas.  To control gas at night, do not eat after 8 p.m. This will give your bowel time to quiet down before you go   to bed.  If gas is a problem, you can purchase  Beano. Sprinkle Beano on the first bite of food before eating to reduce gas. It has no flavor and should not change the taste of your food. You can buy Beano over the counter at your local drugstore.  Foods like fish, onions, garlic, broccoli, asparagus, and cabbage produce odor. Although your pouch is odor-proof, if you eat these foods you may notice a stronger odor when emptying your pouch. If this is a concern, you may want to limit these foods in your diet.  If you have an ileostomy, you will have chronic diarrhea & need to drink more liquids to avoid getting dehydrated.  Consider antidiarrheal medicine like imodium (loperamide) or Lomotil to help slow down bowel movements / diarrhea into your ileostomy bag.  GETTING TO GOOD BOWEL HEALTH WITH AN ILEOSTOMY    With the colon bypassed & not in use, you will have small bowel diarrhea.   It is important to thicken & slow your bowel movements down.   The goal: 4-6 small BOWEL MOVEMENTS A DAY It is important to drink plenty of liquids to avoid getting dehydrated  CONTROLLING ILEOSTOMY DIARRHEA  TAKE A FIBER SUPPLEMENT (FiberCon or Benefiner soluble fiber) twice a day - to thicken stools by absorbing excess fluid and retrain the intestines to act more normally.  Slowly increase the dose over a few weeks.  Too much fiber too soon can backfire and cause cramping & bloating.  TAKE AN IRON SUPPLEMENT twice a day to naturally constipate your bowels.  Usually ferrous sulfate 325mg twice a day)  TAKE ANTI-DIARRHEAL MEDICINES: Loperamide (Imodium) can slow down diarrhea.  Start with two tablets (= 4mg) first and then try one tablet every 6 hours.  Can go up to 2 pills four times day (8 pills of 2mg max) Avoid if you are having fevers or severe pain.  If you are not better or start feeling worse, stop all medicines and call your doctor for advice LoMotil (Diphenoxylate / Atropine) is another medicine that can constipate & slow down bowel moevements Pepto  Bismol (bismuth) can gently thicken bowels as well  If diarrhea is worse,: drink plenty of liquids and try simpler foods for a few days to avoid stressing your intestines further. Avoid dairy products (especially milk & ice cream) for a short time.  The intestines often can lose the ability to digest lactose when stressed. Avoid foods that cause gassiness or bloating.  Typical foods include beans and other legumes, cabbage, broccoli, and dairy foods.  Every person has some sensitivity to other foods, so listen to our body and avoid those foods that trigger problems for you.Call your doctor if you are getting worse or not better.  Sometimes further testing (cultures, endoscopy, X-ray studies, bloodwork, etc) may be needed to help diagnose and treat the cause of the diarrhea. Take extra anti-diarrheal medicines (maximum is 8 pills of 2mg loperamide a day)   Tips for POUCHING an OSTOMY   Changing Your Pouch The best time to change your pouch is in the morning, before eating or drinking anything. Your stoma can function at any time, but it will function more after eating or drinking.   Applying the pouching system  Place all your equipment close at hand before removing your pouch.  Wash your hands.  Stand or sit in front of a mirror. Use the position that works best for you. Remember that you must keep the skin around the stoma   wrinkle-free for a good seal.  Gently remove the used pouch (1-piece system) or the pouch and old wafer (2-piece system). Empty the pouch into the toilet. Save the closure clip to use again.  Wash the stoma itself and the skin around the stoma. Your stoma may bleed a little when being washed. This is normal. Rinse and pat dry. You may use a wash cloth or soft paper towels (like Bounty), mild soap (like Dial, Safeguard, or Ivory), and water. Avoid soaps that contain perfumes or lotions.  For a new pouch (1-piece system) or a new wafer (2-piece system), measure your  stoma using the stoma guide in each box of supplies.  Trace the shape of your stoma onto the back of the new pouch or the back of the new wafer. Cut out the opening. Remove the paper backing and set it aside.  Optional: Apply a skin barrier powder to surrounding skin if it is irritated (bare or weeping), and dust off the excess. Optional: Apply a skin-prep wipe (such as Skin Prep or All-Kare) to the skin around the stoma, and let it dry. Do not apply this solution if the skin is irritated (red, tender, or broken) or if you have shaved around the stoma. Optional: Apply a skin barrier paste (such as Stomahesive, Coloplast, or Premium) around the opening cut in the back of the pouch or wafer. Allow it to dry for 30 to 60 seconds.  Hold the pouch (1-piece system) or wafer (2-piece system) with the sticky side toward your body. Make sure the skin around the stoma is wrinkle-free. Center the opening on the stoma, then press firmly to your abdomen (Fig. 4). Look in the mirror to check if you are placing the pouch, or wafer, in the right position. For a 2-piece system, snap the pouch onto the wafer. Make sure it snaps into place securely.  Place your hand over the stoma and the pouch or wafer for about 30 seconds. The heat from your hand can help the pouch or wafer stick to your skin.  Add deodorant (such as Super Banish or Nullo) to your pouch. Other options include food extracts such as vanilla oil and peppermint extract. Add about 10 drops of the deodorant to the pouch. Then apply the closure clamp. Note: Do not use toxic  chemicals or commercial cleaning agents in your pouch. These substances may harm the stoma.  Optional: For extra seal, apply tape to all 4 sides around the pouch or wafer, as if you were framing a picture. You may use any brand of medical adhesive tape. Change your pouch every 5 to 7 days. Change it immediately if a leak occurs.  Wash your hands afterwards.  If you are wearing a  2-piece system, you may use 2 new pouches per week and alternate them. Rinse the pouch with mild soap and warm water and hang it to dry for the next day. Apply the fresh pouch. Alternate the 2 pouches like this for a week. After a week, change the wafer and begin with 2 new pouches. Place the old pouches in a plastic bag, and put them in the trash.   LIVING WITH AN OSTOMY  Emptying Your Pouch Empty your pouch when it is one-third full (of urine, stool, and/or gas). If you wait until your pouch is fuller than this, it will be more difficult to empty and more noticeable. When you empty your pouch, either put toilet paper in the toilet bowl first, or flush the   toilet while you empty the pouch. This will reduce splashing. You can empty the pouch between your legs or to one side while sitting, or while standing or stooping. If you have a 2-piece system, you can snap off the pouch to empty it. Remember that your stoma may function during this time. If you wish to rinse your pouch after you empty it, a turkey baster can be helpful. When using a baster, squirt water up into the pouch through the opening at the bottom. With a 2-piece system, you can snap off the pouch to rinse it. After rinsing  your pouch, empty it into the toilet. When rinsing your pouch at home, put a few granules of Dreft soap in the rinse water. This helps lubricate and freshen your pouch. The inside of your pouch can be sprayed with non-stick cooking oil (Pam spray). This may help reduce stool sticking to the inside of the pouch.  Bathing You may shower or bathe with your pouch on or off. Remember that your stoma may function during this time.  The materials you use to wash your stoma and the skin around it should be clean, but they do not need to be sterile.  Wearing Your Pouch During hot weather, or if you perspire a lot in general, wear a cover over your pouch. This may prevent a rash on your skin under the pouch. Pouch covers are  sold at ostomy supply stores. Wear the pouch inside your underwear for better support. Watch your weight. Any gain or loss of 10 to 15 pounds or more can change the way your pouch fits.  Going Away From Home A collapsible cup (like those that come in travel kits) or a soft plastic squirt bottle with a pull-up top (like a travel bottle for shampoo) can be used for rinsing your pouch when you are away from home. Tilt the opening of the pouch at an upward angle when using a cup to rinse.  Carry wet wipes or extra tissues to use in public bathrooms.  Carry an extra pouching system with you at all times.  Never keep ostomy supplies in the glove compartment of your car. Extreme heat or cold can damage the skin barriers and adhesive wafers on the pouch.  When you travel, carry your ostomy supplies with you at all times. Keep them within easy reach. Do not pack ostomy supplies in baggage that will be checked or otherwise separated from you, because your baggage might be lost. If you're traveling out of the country, it is helpful to have a letter stating that you are carrying ostomy supplies as a medical necessity.  If you need ostomy supplies while traveling, look in the yellow pages of the telephone book under "Surgical Supplies." Or call the local ostomy organization to find out where supplies are available.  Do not let your ostomy supplies get low. Always order new pouches before you use the last one.  Reducing Odor Limit foods such as broccoli, cabbage, onions, fish, and garlic in your diet to help reduce odor. Each time you empty your pouch, carefully clean the opening of the pouch, both inside and outside, with toilet paper. Rinse your pouch 1 or 2 times daily after you empty it (see directions for emptying your pouch and going away from home). Add deodorant (such as Super Banish or Nullo) to your pouch. Use air deodorizers in your bathroom. Do not add aspirin to your pouch. Even though  aspirin can help prevent odor, it   could cause ulcers on your stoma.  When to call the doctor Call the doctor if you have any of the following symptoms: Purple, black, or white stoma Severe cramps lasting more than 6 hours Severe watery discharge from the stoma lasting more than 6 hours No output from the colostomy for 3 days Excessive bleeding from your stoma Swelling of your stoma to more than 1/2-inch larger than usual Pulling inward of your stoma below skin level Severe skin irritation or deep ulcers Bulging or other changes in your abdomen  When to call your ostomy nurse Call your ostomy/enterostomal therapy (WOCN) nurse if any of the following occurs: Frequent leaking of your pouching system Change in size or appearance of your stoma, causing discomfort or problems with your pouch Skin rash or rawness Weight gain or loss that causes problems with your pouch     FREQUENTLY ASKED QUESTIONS   Why haven't you met any of these folks who have an ostomy?  Well, maybe you have! You just did not recognize them because an ostomy doesn't show. It can be kept secret if you wish. Why, maybe some of your best friends, office associates or neighbors have an ostomy ... you never can tell. People facing ostomy surgery have many quality-of-life questions like: Will you bulge? Smell? Make noises? Will you feel waste leaving your body? Will you be a captive of the toilet? Will you starve? Be a social outcast? Get/stay married? Have babies? Easily bathe, go swimming, bend over?  OK, let's look at what you can expect:   Will you bulge?  Remember, without part of the intestine or bladder, and its contents, you should have a flatter tummy than before. You can expect to wear, with little exception, what you wore before surgery ... and this in-cludes tight clothing and bathing suits.   Will you smell?  Today, thanks to modern odor proof pouching systems, you can walk into an ostomy support group  meeting and not smell anything that is foul or offensive. And, for those with an ileostomy or colostomy who are concerned about odor when emptying their pouch, there are in-pouch deodorants that can be used to eliminate any waste odors that may exist.   Will you make noises?  Everyone produces gas, especially if they are an air-swallower. But intestinal sounds that occur from time to time are no differ-ent than a gurgling tummy, and quite often your clothing will muffle any sounds.   Will you feel the waste discharges?  For those with a colostomy or ileostomy there might be a slight pressure when waste leaves your body, but understand that the intestines have no nerve endings, so there will be no unpleasant sensations. Those with a urostomy will probably be unaware of any kidney drainage.   Will you be a captive of the toilet?  Immediately post-op you will spend more time in the bathroom than you will after your body recovers from surgery. Every person is different, but on average those with an ileostomy or urostomy may empty their pouches 4 to 6 times a day; a little  less if you have a colostomy. The average wear time between pouch system changes is 3 to 5 days and the changing process should take less than 30 minutes.   Will I need to be on a special diet? Most people return to their normal diet when they have recovered from surgery. Be sure to chew your food well, eat a well-balanced diet and drink plenty of fluids. If   you experience problems with a certain food, wait a couple of weeks and try it again.  Will there be odor and noises? Pouching systems are designed to be odor-proof or odor-resistant. There are deodorants that can be used in the pouch. Medications are also available to help reduce odor. Limit gas-producing foods and carbonated beverages. You will experience less gas and fewer noises as you heal from surgery.  How much time will it take to care for my ostomy? At first, you may  spend a lot of time learning about your ostomy and how to take care of it. As you become more comfortable and skilled at changing the pouching system, it will take very little time to care for it.   Will I be able to return to work? People with ostomies can perform most jobs. As soon as you have healed from surgery, you should be able to return to work. Heavy lifting (more than 10 pounds) may be discouraged.   What about intimacy? Sexual relationships and intimacy are important and fulfilling aspects of your life. They should continue after ostomy surgery. Intimacy-related concerns should be discussed openly between you and your partner.   Can I wear regular clothing? You do not need to wear special clothing. Ostomy pouches are fairly flat and barely noticeable. Elastic undergarments will not hurt the stoma or prevent the ostomy from functioning.   Can I participate in sports? An ostomy should not limit your involvement in sports. Many people with ostomies are runners, skiers, swimmers or participate in other active lifestyles. Talk with your caregiver first before doing heavy physical activity.  Will you starve?  Not if you follow doctor's orders at each stage of your post-op adjustment. There is no such thing as an "ostomy diet". Some people with an ostomy will be able to eat and tolerate anything; others may find diffi-culty with some foods. Each person is an individual and must determine, by trial, what is best for them. A good practice for all is to drink plenty of water.   Will you be a social outcast?  Have you met anyone who has an ostomy and is a social outcast? Why should you be the first? Only your attitude and self image will effect how you are treated. No confi-dent person is an outcast.    PROFESSIONAL HELP   Resources are available if you need help or have questions about your ostomy.   Specially trained nurses called Wound, Ostomy Continence Nurses (WOCN) are available for  consultation in most major medical centers.  Consider getting an ostomy consult at an outpatient ostomy clinic.   [facility] has an Ost[facility] Clinic run by an WOCN ostomy nurse at the [facility].  [phone]. [facility] can help set up an appointment   The United Ostomy Association (UOA) is a group made up of many local chapters throughout the [place]. These local groups hold meetings and provide support to prospective and existing ostomates. They sponsor educational events and have qualified visitors to make personal or telephone visits. Contact the UOA for the chapter nearest you and for other educational publications.  More detailed information can be found in Colostomy Guide, a publication of the United Ostomy Association (UOA). Contact UOA at 1-800-826-0826 or visit their web site at www.uoaa.org. The website contains links to other sites, suppliers and resources.  [facility] Secure Start Services: Start at the website to enlist for support.  Your Wound Ostomy (WOCN) nurse may have started this   process. https://www.[employer].com/en/securestart Secure Start services are designed to support people as they live their lives with an ostomy or neurogenic bladder. Enrolling is easy and at no cost to the patient. We realize that each person's needs and life journey are different. Through Secure Start services, we want to help people live their life, their way.  #######################################################  

## 2022-08-01 ENCOUNTER — Telehealth: Payer: Self-pay

## 2022-08-01 NOTE — Telephone Encounter (Signed)
Transition Care Management Follow-up Telephone Call Date of discharge and from where: 07/31/2022, [facility] How have you been since you were released from the hospital? He said he is doing okay, just sore and he does not want to take pain medication for risk of addiction.  He said his bowels are not a soft stool yet, he continue to have some " dripping" and has been wearing attends.  Any questions or concerns? Yes- his landlord is not renewing his lease and he will need to be out of his apartment by 09/21/2022.  His daughter will help him locate a new apartment but in the event that he does not have a place to live when he needs to be out of his apartment, he will go to [place] to stay with his daughter.  He may need to stay there for a few weeks until he can fid a place of his own.  Items Reviewed: Did the pt receive and understand the discharge instructions provided? Yes  Medications obtained and verified? Yes - he said he has all of his medications and did not have any questions about the med regime.  Other? No  Any new allergies since your discharge? No  Dietary orders reviewed? Yes Do you have support at home? Yes - his daughter is currently here with him   Home Care and Equipment/Supplies: Were home health services ordered? no If so, what is the name of the agency? N/a  Has the agency set up a time to come to the patient's home? not applicable Were any new equipment or medical supplies ordered?  No What is the name of the medical supply agency? N/a Were you able to get the supplies/equipment? not applicable Do you have any questions related to the use of the equipment or supplies? No  Functional Questionnaire: (I = Independent and D = Dependent) ADLs: independent  Follow up appointments reviewed:  PCP Hospital f/u appt confirmed? Yes  Scheduled to see Dr Wynetta Emery - 09/05/2022.  [facility] f/u appt confirmed? Yes  Scheduled to see surgeon - 08/15/2022.   Are  transportation arrangements needed? No  If their condition worsens, is the pt aware to call PCP or go to the Emergency Dept.? Yes Was the patient provided with contact information for the PCP's office or ED? Yes Was to pt encouraged to call back with questions or concerns? Yes

## 2022-08-30 ENCOUNTER — Other Ambulatory Visit: Payer: Self-pay | Admitting: Surgery

## 2022-08-30 DIAGNOSIS — K4090 Unilateral inguinal hernia, without obstruction or gangrene, not specified as recurrent: Secondary | ICD-10-CM | POA: Diagnosis not present

## 2022-08-30 DIAGNOSIS — R6889 Other general symptoms and signs: Secondary | ICD-10-CM | POA: Diagnosis not present

## 2022-08-30 DIAGNOSIS — N5089 Other specified disorders of the male genital organs: Secondary | ICD-10-CM | POA: Diagnosis not present

## 2022-08-30 DIAGNOSIS — K432 Incisional hernia without obstruction or gangrene: Secondary | ICD-10-CM | POA: Diagnosis not present

## 2022-08-31 ENCOUNTER — Other Ambulatory Visit: Payer: Self-pay | Admitting: Surgery

## 2022-08-31 DIAGNOSIS — N5089 Other specified disorders of the male genital organs: Secondary | ICD-10-CM

## 2022-09-05 ENCOUNTER — Encounter: Payer: Self-pay | Admitting: Internal Medicine

## 2022-09-05 ENCOUNTER — Ambulatory Visit: Payer: Medicare HMO | Attending: Internal Medicine | Admitting: Internal Medicine

## 2022-09-05 VITALS — BP 124/81 | HR 85 | Temp 98.3°F | Ht 69.0 in | Wt 241.0 lb

## 2022-09-05 DIAGNOSIS — E876 Hypokalemia: Secondary | ICD-10-CM

## 2022-09-05 DIAGNOSIS — R6889 Other general symptoms and signs: Secondary | ICD-10-CM | POA: Diagnosis not present

## 2022-09-05 DIAGNOSIS — I152 Hypertension secondary to endocrine disorders: Secondary | ICD-10-CM | POA: Diagnosis not present

## 2022-09-05 DIAGNOSIS — E1169 Type 2 diabetes mellitus with other specified complication: Secondary | ICD-10-CM | POA: Diagnosis not present

## 2022-09-05 DIAGNOSIS — Z85038 Personal history of other malignant neoplasm of large intestine: Secondary | ICD-10-CM | POA: Diagnosis not present

## 2022-09-05 DIAGNOSIS — E1159 Type 2 diabetes mellitus with other circulatory complications: Secondary | ICD-10-CM | POA: Diagnosis not present

## 2022-09-05 DIAGNOSIS — E669 Obesity, unspecified: Secondary | ICD-10-CM | POA: Diagnosis not present

## 2022-09-05 DIAGNOSIS — K439 Ventral hernia without obstruction or gangrene: Secondary | ICD-10-CM | POA: Diagnosis not present

## 2022-09-05 DIAGNOSIS — K4090 Unilateral inguinal hernia, without obstruction or gangrene, not specified as recurrent: Secondary | ICD-10-CM | POA: Diagnosis not present

## 2022-09-05 DIAGNOSIS — R972 Elevated prostate specific antigen [PSA]: Secondary | ICD-10-CM | POA: Diagnosis not present

## 2022-09-05 DIAGNOSIS — E785 Hyperlipidemia, unspecified: Secondary | ICD-10-CM

## 2022-09-05 NOTE — Patient Instructions (Signed)
Please remember to call [facility] and schedule a follow-up appointment for when you will be back in town.  If you end up staying in [place], you should establish with a urologist there.  Avoid any heavy lifting, pushing or pulling with you having hernias.  If you develop any abdominal and or scrotal pain, nausea, vomiting or unable to pass your bowel, you should be seen in the emergency room

## 2022-09-05 NOTE — Progress Notes (Signed)
Patient ID: Tyler Wise, male    DOB: [birth year]  MRN: [id number]  CC: Diabetes (DM f/u./Discuss hernia. Scheduled for ultrasound. Neoma Laming received flu vax. )   Subjective: Tyler Wise is a 72 y.o. male who presents for chronic ds management His concerns today include:  Patient with history of HTN: HL, DM, former tob user, history of colon cancer of the ascending colon (s/p partial colectomy RT 02/2021; robotic assisted left colectomy 07/2022 due to tubulovillous adenomatous polyp with high-grade dysplasia Dr. Marcello Moores)   Since last visit with me, patient was hospitalized 1/3-01/2023 and underwent left-sided colectomy by Dr. Marcello Moores due to tubulovillous adenomatous polyp with HGD -Postsurgery, I see that potassium level was low at 3.3 and calcium level 8.6. On hosp f/u with Dr. Marcello Moores, pt states he was discovered to have hernia above the umbilicus and large LT scrotal hernia. Scheduled for scrotum US.  He is not sure how long he has had the scrotal hernia. No pain from either hernia.  No N/V.  Never had to take the Tramadol that was ordered post surgery. He will be staying with his daughter in [place] starting this Wednesday.  His  condo that he was renting was sold to a new owner last fall.  He was given notice that his lease will not be renewed.  Has had problems finding a new place in his price range.  He will stay with his daughter who will help him find a place.  At this time, the plan is to stay in [place] temporarily  HTN: Reports compliance with Norvasc 10 mg.  Does not check BP.  Limits salt in foods.  No chest pains or shortness of breath. HL: Taking and tolerating atorvastatin 20 mg.  No muscle cramps Diabetes: Recent A1c in December was 6.4.  He is on metformin 500 mg daily. Does well with eating habits.  Has a sweet cravings about once a wk. Wgh down 12 lbs since surgery.  He attributes this to cutting back on white carbs and significantly limiting sugar drinks and  snacks  Elevated PSA:  pt did call [facility] to make an appt but was told they will call him back with appt.  No one has since called back.   Patient Active Problem List   Diagnosis Date Noted   Colon polyp 07/27/2022   Trigger thumb, right thumb 10/05/2021   Essential hypertension 05/07/2021   Hyperlipidemia 05/07/2021   Obesity (BMI 30.0-34.9) 05/07/2021   Prediabetes 05/07/2021   Iron deficiency anemia due to chronic blood loss 05/07/2021   History of colon cancer 05/07/2021     Current Outpatient Medications on File Prior to Visit  Medication Sig Dispense Refill   amLODipine (NORVASC) 10 MG tablet TAKE 1 TABLET EVERY DAY 90 tablet 1   aspirin EC 81 MG tablet Take 81 mg by mouth daily. Swallow whole.     atorvastatin (LIPITOR) 20 MG tablet Take 20 mg by mouth daily.     metFORMIN (GLUCOPHAGE) 500 MG tablet Take 1 tablet (500 mg total) by mouth 2 (two) times daily with a meal. 180 tablet 3   tamsulosin (FLOMAX) 0.4 MG CAPS capsule TAKE 2 CAPSULES EVERY DAY 180 capsule 0   traMADol (ULTRAM) 50 MG tablet Take 1-2 tablets (50-100 mg total) by mouth every 6 (six) hours as needed for moderate pain. (Patient not taking: Reported on 09/05/2022) 30 tablet 0   No current facility-administered medications on file prior to visit.    No Known Allergies  Social History   Socioeconomic History   Marital status: Single    Spouse name: Not on file   Number of children: Not on file   Years of education: Not on file   Highest education level: Not on file  Occupational History   Not on file  Tobacco Use   Smoking status: Former    Packs/day: 1.50    Years: 3.00    Total pack years: 4.50    Types: Cigarettes    Quit date: 12/24/1974    Years since quitting: 47.7   Smokeless tobacco: Never  Vaping Use   Vaping Use: Never used  Substance and Sexual Activity   Alcohol use: Not Currently   Drug use: Never   Sexual activity: Not Currently  Other Topics Concern   Not on file   Social History Narrative   Not on file   Social Determinants of Health   Financial Resource Strain: Not on file  Food Insecurity: No Food Insecurity (07/27/2022)   Hunger Vital Sign    Worried About Running Out of Food in the Last Year: Never true    Ran Out of Food in the Last Year: Never true  Transportation Needs: No Transportation Needs (07/27/2022)   PRAPARE - Hydrologist (Medical): No    Lack of Transportation (Non-Medical): No  Physical Activity: Not on file  Stress: Not on file  Social Connections: Not on file  Intimate Partner Violence: Not At Risk (07/27/2022)   Humiliation, Afraid, Rape, and Kick questionnaire    Fear of Current or Ex-Partner: No    Emotionally Abused: No    Physically Abused: No    Sexually Abused: No    No family history on file.  Past Surgical History:  Procedure Laterality Date   HERNIA REPAIR     umbilical repair as a child   LAPAROSCOPIC PARTIAL COLECTOMY N/A 03/10/2021   Procedure: LAPAROSCOPIC RIGHT PARTIAL COLECTOMY WITH A PRIMARY UMBILICAL HERNIA REPAIR;  Surgeon: Leighton Ruff, MD;  Location: [facility];  Service: General;  Laterality: N/A;   TONSILLECTOMY      ROS: Review of Systems Negative except as stated above  PHYSICAL EXAM: BP 124/81 (BP Location: Left Arm, Patient Position: Sitting, Cuff Size: Large)   Pulse 85   Temp 98.3 F (36.8 C) (Oral)   Ht 5' 9"$  (1.753 m)   Wt 241 lb (109.3 kg)   SpO2 93%   BMI 35.59 kg/m   Wt Readings from Last 3 Encounters:  09/05/22 241 lb (109.3 kg)  07/31/22 253 lb 8.5 oz (115 kg)  07/20/22 250 lb (113.4 kg)    Physical Exam   General appearance - alert, well appearing, and in no distress Mental status - normal mood, behavior, speech, dress, motor activity, and thought processes Neck - supple, no significant adenopathy Chest - clear to auscultation, no wheezes, rales or rhonchi, symmetric air entry Heart - normal rate, regular rhythm, normal S1, S2, no  murmurs, rubs, clicks or gallops Abdomen -normal bowel sounds.  Nondistended.  Soft.  Healed midline scar above and below umbilicus.  Small midline hernia about 5 to 7 cm in length palpated incision above the umbilicus. GU Male -large hernia left scrotal sac.  Firm to touch.  Nontender. Extremities -no lower extremity edema    09/05/2022    9:02 AM  MMSE - Mini Mental State Exam  Orientation to time 5  Orientation to Place 5  Registration 3  Attention/ Calculation 5  Recall  3  Language- name 2 objects 2  Language- repeat 1  Language- follow 3 step command 3  Language- read & follow direction 1  Write a sentence 1  Copy design 1  Total score 30       Latest Ref Rng & Units 07/30/2022    5:20 AM 07/28/2022    4:56 AM 07/20/2022    8:34 AM  CMP  Glucose 70 - 99 mg/dL 98  134  101   BUN 8 - 23 mg/dL 8  11  13   $ Creatinine 0.61 - 1.24 mg/dL 0.98  1.09  1.30   Sodium 135 - 145 mmol/L 138  137  141   Potassium 3.5 - 5.1 mmol/L 3.3  3.5  4.3   Chloride 98 - 111 mmol/L 103  107  108   CO2 22 - 32 mmol/L 25  22  24   $ Calcium 8.9 - 10.3 mg/dL 8.6  8.3  8.9    Lipid Panel     Component Value Date/Time   CHOL 164 05/07/2021 0940   TRIG 79 05/07/2021 0940   HDL 59 05/07/2021 0940   CHOLHDL 2.8 05/07/2021 0940   LDLCALC 90 05/07/2021 0940    CBC    Component Value Date/Time   WBC 13.5 (H) 07/30/2022 0520   RBC 5.72 07/30/2022 0520   HGB 14.1 07/30/2022 0520   HGB 15.2 04/29/2022 1505   HCT 43.4 07/30/2022 0520   HCT 45.5 04/29/2022 1505   PLT 281 07/30/2022 0520   PLT 329 04/29/2022 1505   MCV 75.9 (L) 07/30/2022 0520   MCV 77 (L) 04/29/2022 1505   MCH 24.7 (L) 07/30/2022 0520   MCHC 32.5 07/30/2022 0520   RDW 18.5 (H) 07/30/2022 0520   RDW 17.2 (H) 04/29/2022 1505   LYMPHSABS 1.5 09/29/2021 1258   EOSABS 0.1 09/29/2021 1258   BASOSABS 0.1 09/29/2021 1258    ASSESSMENT AND PLAN: 1. Diabetes mellitus type 2 in obese (HCC) A1c at goal. Continue metformin 500 mg  daily. Discussed on encourage healthy eating habits.  Advised healthy snacks like fruits or nuts. - CBC  2. Hypertension associated with type 2 diabetes mellitus ([place]) Close to goal.  Continue amlodipine 10 mg daily - Basic Metabolic Panel  3. Hyperlipidemia associated with type 2 diabetes mellitus (HCC) Continue atorvastatin 20 mg daily  4. Abnormal PSA Strongly advised him to get back in contact with [facility] for an appointment.  He declines having me submit the referral stating he needs to wait until things settle in [place].  I advised him that if he decides to stay in [place] he should follow-up with the urologist there.  He expressed understanding.  5. History of colon cancer Status post resection of right and left colon  6. Ventral hernia without obstruction or gangrene Not bothersome to him at this time.  Went over signs and symptoms that would suggest obstruction and need to be seen in the emergency room.  Advised heavy lifting, pushing and pulling.  7. Hernia of scrotum He has an ultrasound scheduled for next month.  He states that his daughter has to come to [place] on that date and the study is scheduled to be done in [place].  Encouraged him to have this looked at by a urologist as well.  8. Hypokalemia Check BMP today.  9. Hypocalcemia - VITAMIN D 25 Hydroxy (Vit-D Deficiency, Fractures)     Patient was given the opportunity to ask questions.  Patient verbalized understanding of the  plan and was able to repeat key elements of the plan.   This documentation was completed using Radio producer.  Any transcriptional errors are unintentional.  No orders of the defined types were placed in this encounter.    Requested Prescriptions    No prescriptions requested or ordered in this encounter    No follow-ups on file.  Karle Plumber, MD, FACP

## 2022-09-06 ENCOUNTER — Other Ambulatory Visit: Payer: Self-pay

## 2022-09-06 ENCOUNTER — Other Ambulatory Visit: Payer: Self-pay | Admitting: Internal Medicine

## 2022-09-06 LAB — BASIC METABOLIC PANEL
BUN/Creatinine Ratio: 8 — ABNORMAL LOW (ref 10–24)
BUN: 9 mg/dL (ref 8–27)
CO2: 21 mmol/L (ref 20–29)
Calcium: 9.7 mg/dL (ref 8.6–10.2)
Chloride: 104 mmol/L (ref 96–106)
Creatinine, Ser: 1.08 mg/dL (ref 0.76–1.27)
Glucose: 86 mg/dL (ref 70–99)
Potassium: 4.2 mmol/L (ref 3.5–5.2)
Sodium: 145 mmol/L — ABNORMAL HIGH (ref 134–144)
eGFR: 73 mL/min/{1.73_m2} (ref >59)

## 2022-09-06 LAB — CBC
Hematocrit: 43.1 % (ref 37.5–51.0)
Hemoglobin: 14.2 g/dL (ref 13.0–17.7)
MCH: 25.2 pg — ABNORMAL LOW (ref 26.6–33.0)
MCHC: 32.9 g/dL (ref 31.5–35.7)
MCV: 77 fL — ABNORMAL LOW (ref 79–97)
Platelets: 362 10*3/uL (ref 150–450)
RBC: 5.63 x10E6/uL (ref 4.14–5.80)
RDW: 17 % — ABNORMAL HIGH (ref 11.6–15.4)
WBC: 11.5 10*3/uL — ABNORMAL HIGH (ref 3.4–10.8)

## 2022-09-06 LAB — VITAMIN D 25 HYDROXY (VIT D DEFICIENCY, FRACTURES): Vit D, 25-Hydroxy: 23 ng/mL — ABNORMAL LOW (ref 30.0–100.0)

## 2022-09-06 MED ORDER — VITAMIN D (CHOLECALCIFEROL) 10 MCG (400 UNIT) PO CAPS: 400.0000 [IU]/d | ORAL_CAPSULE | Freq: Every day | ORAL | 1 refills | Status: DC

## 2022-09-06 NOTE — Telephone Encounter (Signed)
Pt given lab results per notes of Dr. Wynetta Emery on 09/06/22. Pt verbalized understanding. Pt woild like Vit D sent to [place].

## 2022-09-06 NOTE — Progress Notes (Signed)
Let patient know that his kidney function is good.  Sodium level mildly elevated.  Make sure that he is drinking several glasses of water daily. Vitamin D level is still low.  I recommend taking vitamin D supplement 400 IU daily.  Prescription sent to his pharmacy.  If it is not covered by his insurance, he can purchase it over-the-counter.  Slight elevation in white blood cell counts but improved compared to when he was released from the hospital.  White blood cells are the blood cells that help fight off infection.

## 2022-09-06 NOTE — Telephone Encounter (Signed)
Change of pharmacy Requested Prescriptions  Pending Prescriptions Disp Refills   Vitamin D, Cholecalciferol, 10 MCG (400 UNIT) CAPS 100 capsule 1    Sig: Take 400 Int'l Units/day by mouth daily.     Endocrinology:  Vitamins - Vitamin D Supplementation 2 Failed - 09/06/2022  1:26 PM      Failed - Manual Review: Route requests for 50,000 IU strength to the provider      Failed - Vitamin D in normal range and within 360 days    Vit D, 25-Hydroxy  Date Value Ref Range Status  09/05/2022 23.0 (L) 30.0 - 100.0 ng/mL Final    Comment:    Vitamin D deficiency has been defined by the [place] practice guideline as a level of serum 25-OH vitamin D less than 20 ng/mL (1,2). The Endocrine Society went on to further define vitamin D insufficiency as a level between 21 and 29 ng/mL (2). 1. IOM (Institute of Medicine). 2010. Dietary reference    intakes for calcium and D. [place]: The    [employer]. 2. Holick MF, Binkley [place], Bischoff-Ferrari HA, et al.    Evaluation, treatment, and prevention of vitamin D    deficiency: an Endocrine Society clinical practice    guideline. JCEM. 2011 Jul; 96(7):1911-30.          Passed - Ca in normal range and within 360 days    Calcium  Date Value Ref Range Status  09/05/2022 9.7 8.6 - 10.2 mg/dL Final         Passed - Valid encounter within last 12 months    Recent Outpatient Visits           Yesterday Diabetes mellitus type 2 in obese [facility])   [place] Karle Plumber B, MD   4 months ago Diabetes mellitus type 2 in obese [facility])   [place] Karle Plumber B, MD   8 months ago Type 2 diabetes mellitus with hyperlipidemia [facility])   [place] Karle Plumber B, MD   11 months ago Iron deficiency anemia due to chronic blood loss   [facility] Roe,  Dionne Bucy, [place]   1 year ago Encounter to establish care   [place], MD       Future Appointments             In 4 months Wynetta Emery Dalbert Batman, MD [place]

## 2022-09-09 ENCOUNTER — Telehealth: Payer: Medicare HMO

## 2022-09-22 ENCOUNTER — Telehealth: Payer: Self-pay | Admitting: Internal Medicine

## 2022-09-22 NOTE — Telephone Encounter (Signed)
Copied from [place] [id number]. Topic: Medicare AWV >> Sep 22, 2022 11:21 AM Carolan Shiver wrote: Reason for CRM: Called patient to schedule Medicare Annual Wellness Visit (AWV). Left message for patient to call back and schedule Medicare Annual Wellness Visit (AWV).  Last date of AWV: due awvi 12/24/15 per [place]     If any questions, please contact me at [phone].  Thank you ,  Barkley Boards AWV direct phone # [phone]

## 2022-10-04 ENCOUNTER — Other Ambulatory Visit: Payer: Medicare HMO

## 2022-10-04 ENCOUNTER — Telehealth: Payer: Self-pay | Admitting: Licensed Clinical Social Worker

## 2022-10-04 ENCOUNTER — Ambulatory Visit
Admission: RE | Admit: 2022-10-04 | Discharge: 2022-10-04 | Disposition: A | Discharge status: Home / Self Care | Payer: Medicare HMO | Source: Ambulatory Visit | Attending: Surgery | Admitting: Surgery

## 2022-10-04 DIAGNOSIS — I861 Scrotal varices: Secondary | ICD-10-CM | POA: Diagnosis not present

## 2022-10-04 DIAGNOSIS — N433 Hydrocele, unspecified: Secondary | ICD-10-CM | POA: Diagnosis not present

## 2022-10-04 DIAGNOSIS — N5089 Other specified disorders of the male genital organs: Secondary | ICD-10-CM

## 2022-10-04 DIAGNOSIS — N503 Cyst of epididymis: Secondary | ICD-10-CM | POA: Diagnosis not present

## 2022-10-04 NOTE — Patient Outreach (Signed)
  Care Coordination   10/04/2022 Name: Tyler Wise MRN: [id number] DOB: [birth year]   Care Coordination Outreach Attempts:  An unsuccessful telephone outreach was attempted today to offer the patient information about available care coordination services as a benefit of their health plan.   Follow Up Plan:  Additional outreach attempts will be made to offer the patient care coordination information and services.   Encounter Outcome:  No Answer   Care Coordination Interventions:  No, not indicated    Christa See, MSW, [email or web address] Phone [phone] 1:00 PM

## 2022-10-05 ENCOUNTER — Telehealth: Payer: Self-pay | Admitting: Internal Medicine

## 2022-10-05 NOTE — Telephone Encounter (Signed)
Phone call placed to patient this afternoon.  I informed patient that I received the results of the ultrasound done on the testicles yesterday.  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  The radiologist states that this can be benign or possible cancerous lesion.  Informed patient that it is very important that he follows up on this with his urologist.  He is currently with his daughter in [place].  He states that it may be expensive for him to see a urologist there so he will probably try to make arrangements to come back to [place] to see the urologist at [facility] urology.  Patient expressed understanding.  All questions were answered.

## 2022-10-11 ENCOUNTER — Other Ambulatory Visit: Payer: Medicare HMO

## 2022-10-13 ENCOUNTER — Other Ambulatory Visit: Payer: Medicare HMO

## 2022-11-30 ENCOUNTER — Other Ambulatory Visit: Payer: Self-pay | Admitting: Internal Medicine

## 2022-11-30 DIAGNOSIS — I10 Essential (primary) hypertension: Secondary | ICD-10-CM

## 2022-11-30 NOTE — Telephone Encounter (Signed)
Requested Prescriptions  Pending Prescriptions Disp Refills   amLODipine (NORVASC) 10 MG tablet [Pharmacy Med Name: AMLODIPINE BESYLATE 10 MG Tablet] 90 tablet 0    Sig: TAKE 1 TABLET EVERY DAY     Cardiovascular: Calcium Channel Blockers 2 Passed - 11/30/2022  1:13 AM      Passed - Last BP in normal range    BP Readings from Last 1 Encounters:  09/05/22 124/81         Passed - Last Heart Rate in normal range    Pulse Readings from Last 1 Encounters:  09/05/22 85         Passed - Valid encounter within last 6 months    Recent Outpatient Visits           2 months ago Diabetes mellitus type 2 in obese [facility])   [place] [facility] Jonah Blue B, MD   7 months ago Diabetes mellitus type 2 in obese [facility])   [place] [facility] Jonah Blue B, MD   11 months ago Type 2 diabetes mellitus with hyperlipidemia [facility])   [place] [facility] Jonah Blue B, MD   1 year ago Iron deficiency anemia due to chronic blood loss   [place] [facility] [place], Marzella Schlein, [place]   1 year ago Encounter to establish care   [facility] Marcine Matar, MD       Future Appointments             In 1 month Laural Benes, Binnie Rail, MD [facility]

## 2022-12-02 ENCOUNTER — Telehealth: Payer: Self-pay | Admitting: Internal Medicine

## 2022-12-02 NOTE — Telephone Encounter (Signed)
Copied from CRM [id number]. Topic: Medicare AWV >> Dec 02, 2022 12:09 PM Rushie Goltz wrote: Reason for CRM: Called patient to schedule Medicare Annual Wellness Visit (AWV). Left message for patient to call back and schedule Medicare Annual Wellness Visit (AWV).  Last date of AWV: AWVI eligible as of 12/24/15   Please schedule an AWVI appointment at any time with [facility] VISIT.  If any questions, please contact me at [phone].    Thank you,  [facility] Direct dial  [phone]

## 2022-12-26 ENCOUNTER — Other Ambulatory Visit: Payer: Self-pay | Admitting: Internal Medicine

## 2022-12-27 NOTE — Telephone Encounter (Signed)
Requested Prescriptions  Pending Prescriptions Disp Refills   tamsulosin (FLOMAX) 0.4 MG CAPS capsule [Pharmacy Med Name: TAMSULOSIN HYDROCHLORIDE 0.4 MG Capsule] 180 capsule 0    Sig: TAKE 2 CAPSULES EVERY DAY     Urology: Alpha-Adrenergic Blocker Failed - 12/26/2022  9:38 AM      Failed - PSA in normal range and within 360 days    No results found for: "LABPSA", "PSA", "PSA1", "ULTRAPSA"       Passed - Last BP in normal range    BP Readings from Last 1 Encounters:  09/05/22 124/81         Passed - Valid encounter within last 12 months    Recent Outpatient Visits           3 months ago Diabetes mellitus type 2 in obese [facility])   [place] [facility] Jonah Blue B, MD   8 months ago Diabetes mellitus type 2 in obese [facility])   [place] [facility] Jonah Blue B, MD   12 months ago Type 2 diabetes mellitus with hyperlipidemia [facility])   [place] [facility] Jonah Blue B, MD   1 year ago Iron deficiency anemia due to chronic blood loss   [facility] [place], Marzella Schlein, [place]   1 year ago Encounter to establish care   [place] Marcine Matar, MD       Future Appointments             In 1 week Marcine Matar, MD [facility]

## 2023-01-02 NOTE — Patient Instructions (Incomplete)
Tyler Wise , Thank you for taking time to come for your Medicare Wellness Visit. I appreciate your ongoing commitment to your health goals. Please review the following plan we discussed and let me know if I can assist you in the future.   These are the goals we discussed:  Goals   None     This is a list of the screening recommended for you and due dates:  Health Maintenance  Topic Date Due   Hepatitis C Screening  Never done   Colon Cancer Screening  Never done   COVID-19 Vaccine (3 - 2023-24 season) 03/25/2022   Medicare Annual Wellness Visit  09/13/2022   Yearly kidney health urinalysis for diabetes  12/31/2022   Flu Shot  02/23/2023   Yearly kidney function blood test for diabetes  09/06/2023   DTaP/Tdap/Td vaccine (2 - Td or Tdap) 05/08/2031   Pneumonia Vaccine  Completed   Zoster (Shingles) Vaccine  Completed   HPV Vaccine  Aged Out    Advanced directives: Information on Advanced Care Planning can be found at [facility] Advance Health Care Directives Advance Health Care Directives ([email or web address] Please bring a copy of your health care power of attorney and living will to the office to be added to your chart at your convenience.   Conditions/risks identified: Aim for 30 minutes of exercise or brisk walking, 6-8 glasses of water, and 5 servings of fruits and vegetables each day.  Next appointment: Follow up in one year for your annual wellness visit.   Preventive Care 24 Years and Older, Male  Preventive care refers to lifestyle choices and visits with your health care provider that can promote health and wellness. What does preventive care include? A yearly physical exam. This is also called an annual well check. Dental exams once or twice a year. Routine eye exams. Ask your health care provider how often you should have your eyes checked. Personal lifestyle choices, including: Daily care of your teeth and gums. Regular physical activity. Eating a  healthy diet. Avoiding tobacco and drug use. Limiting alcohol use. Practicing safe sex. Taking low doses of aspirin every day. Taking vitamin and mineral supplements as recommended by your health care provider. What happens during an annual well check? The services and screenings done by your health care provider during your annual well check will depend on your age, overall health, lifestyle risk factors, and family history of disease. Counseling  Your health care provider may ask you questions about your: Alcohol use. Tobacco use. Drug use. Emotional well-being. Home and relationship well-being. Sexual activity. Eating habits. History of falls. Memory and ability to understand (cognition). Work and work Astronomer. Screening  You may have the following tests or measurements: Height, weight, and BMI. Blood pressure. Lipid and cholesterol levels. These may be checked every 5 years, or more frequently if you are over 85 years old. Skin check. Lung cancer screening. You may have this screening every year starting at age 16 if you have a 30-pack-year history of smoking and currently smoke or have quit within the past 15 years. Fecal occult blood test (FOBT) of the stool. You may have this test every year starting at age 69. Flexible sigmoidoscopy or colonoscopy. You may have a sigmoidoscopy every 5 years or a colonoscopy every 10 years starting at age 62. Prostate cancer screening. Recommendations will vary depending on your family history and other risks. Hepatitis C blood test. Hepatitis B blood test. Sexually transmitted disease (STD) testing. Diabetes  screening. This is done by checking your blood sugar (glucose) after you have not eaten for a while (fasting). You may have this done every 1-3 years. Abdominal aortic aneurysm (AAA) screening. You may need this if you are a current or former smoker. Osteoporosis. You may be screened starting at age 10 if you are at high risk. Talk  with your health care provider about your test results, treatment options, and if necessary, the need for more tests. Vaccines  Your health care provider may recommend certain vaccines, such as: Influenza vaccine. This is recommended every year. Tetanus, diphtheria, and acellular pertussis (Tdap, Td) vaccine. You may need a Td booster every 10 years. Zoster vaccine. You may need this after age 36. Pneumococcal 13-valent conjugate (PCV13) vaccine. One dose is recommended after age 22. Pneumococcal polysaccharide (PPSV23) vaccine. One dose is recommended after age 42. Talk to your health care provider about which screenings and vaccines you need and how often you need them. This information is not intended to replace advice given to you by your health care provider. Make sure you discuss any questions you have with your health care provider. Document Released: 08/07/2015 Document Revised: 03/30/2016 Document Reviewed: 05/12/2015 Elsevier Interactive Patient Education  2017 [employer].  Fall Prevention in the Home Falls can cause injuries. They can happen to people of all ages. There are many things you can do to make your home safe and to help prevent falls. What can I do on the outside of my home? Regularly fix the edges of walkways and driveways and fix any cracks. Remove anything that might make you trip as you walk through a door, such as a raised step or threshold. Trim any bushes or trees on the path to your home. Use bright outdoor lighting. Clear any walking paths of anything that might make someone trip, such as rocks or tools. Regularly check to see if handrails are loose or broken. Make sure that both sides of any steps have handrails. Any raised decks and porches should have guardrails on the edges. Have any leaves, snow, or ice cleared regularly. Use sand or salt on walking paths during winter. Clean up any spills in your garage right away. This includes oil or grease  spills. What can I do in the bathroom? Use night lights. Install grab bars by the toilet and in the tub and shower. Do not use towel bars as grab bars. Use non-skid mats or decals in the tub or shower. If you need to sit down in the shower, use a plastic, non-slip stool. Keep the floor dry. Clean up any water that spills on the floor as soon as it happens. Remove soap buildup in the tub or shower regularly. Attach bath mats securely with double-sided non-slip rug tape. Do not have throw rugs and other things on the floor that can make you trip. What can I do in the bedroom? Use night lights. Make sure that you have a light by your bed that is easy to reach. Do not use any sheets or blankets that are too big for your bed. They should not hang down onto the floor. Have a firm chair that has side arms. You can use this for support while you get dressed. Do not have throw rugs and other things on the floor that can make you trip. What can I do in the kitchen? Clean up any spills right away. Avoid walking on wet floors. Keep items that you use a lot in easy-to-reach places.  If you need to reach something above you, use a strong step stool that has a grab bar. Keep electrical cords out of the way. Do not use floor polish or wax that makes floors slippery. If you must use wax, use non-skid floor wax. Do not have throw rugs and other things on the floor that can make you trip. What can I do with my stairs? Do not leave any items on the stairs. Make sure that there are handrails on both sides of the stairs and use them. Fix handrails that are broken or loose. Make sure that handrails are as long as the stairways. Check any carpeting to make sure that it is firmly attached to the stairs. Fix any carpet that is loose or worn. Avoid having throw rugs at the top or bottom of the stairs. If you do have throw rugs, attach them to the floor with carpet tape. Make sure that you have a light switch at the  top of the stairs and the bottom of the stairs. If you do not have them, ask someone to add them for you. What else can I do to help prevent falls? Wear shoes that: Do not have high heels. Have rubber bottoms. Are comfortable and fit you well. Are closed at the toe. Do not wear sandals. If you use a stepladder: Make sure that it is fully opened. Do not climb a closed stepladder. Make sure that both sides of the stepladder are locked into place. Ask someone to hold it for you, if possible. Clearly mark and make sure that you can see: Any grab bars or handrails. First and last steps. Where the edge of each step is. Use tools that help you move around (mobility aids) if they are needed. These include: Canes. Walkers. Scooters. Crutches. Turn on the lights when you go into a dark area. Replace any light bulbs as soon as they burn out. Set up your furniture so you have a clear path. Avoid moving your furniture around. If any of your floors are uneven, fix them. If there are any pets around you, be aware of where they are. Review your medicines with your doctor. Some medicines can make you feel dizzy. This can increase your chance of falling. Ask your doctor what other things that you can do to help prevent falls. This information is not intended to replace advice given to you by your health care provider. Make sure you discuss any questions you have with your health care provider. Document Released: 05/07/2009 Document Revised: 12/17/2015 Document Reviewed: 08/15/2014 Elsevier Interactive Patient Education  2017 [employer].

## 2023-01-02 NOTE — Progress Notes (Unsigned)
Subjective:   Tyler Wise is a 72 y.o. male who presents for an Initial Medicare Annual Wellness Visit.  Review of Systems    ***       Objective:    There were no vitals filed for this visit. There is no height or weight on file to calculate BMI.     07/27/2022    1:16 PM 07/20/2022    8:08 AM 03/10/2021    4:16 PM 03/08/2021    8:40 AM  Advanced Directives  Does Patient Have a Medical Advance Directive? No No No No  Would patient like information on creating a medical advance directive? No - Guardian declined No - Patient declined Yes (MAU/Ambulatory/Procedural Areas - Information given) Yes (MAU/Ambulatory/Procedural Areas - Information given)    Current Medications (verified) Outpatient Encounter Medications as of 01/03/2023  Medication Sig   amLODipine (NORVASC) 10 MG tablet TAKE 1 TABLET EVERY DAY   aspirin EC 81 MG tablet Take 81 mg by mouth daily. Swallow whole.   atorvastatin (LIPITOR) 20 MG tablet Take 20 mg by mouth daily.   metFORMIN (GLUCOPHAGE) 500 MG tablet Take 1 tablet (500 mg total) by mouth 2 (two) times daily with a meal.   tamsulosin (FLOMAX) 0.4 MG CAPS capsule TAKE 2 CAPSULES EVERY DAY   traMADol (ULTRAM) 50 MG tablet Take 1-2 tablets (50-100 mg total) by mouth every 6 (six) hours as needed for moderate pain. (Patient not taking: Reported on 09/05/2022)   Vitamin D, Cholecalciferol, 10 MCG (400 UNIT) CAPS Take 400 Int'l Units/day by mouth daily.   No facility-administered encounter medications on file as of 01/03/2023.    Allergies (verified) Patient has no known allergies.   History: Past Medical History:  Diagnosis Date   Anemia    Arthritis    Colon cancer (HCC) 01/2021   Hyperlipidemia    Hypertension    Pre-diabetes    Past Surgical History:  Procedure Laterality Date   HERNIA REPAIR     umbilical repair as a child   LAPAROSCOPIC PARTIAL COLECTOMY N/A 03/10/2021   Procedure: LAPAROSCOPIC RIGHT PARTIAL COLECTOMY WITH A PRIMARY  UMBILICAL HERNIA REPAIR;  Surgeon: Romie Levee, MD;  Location: [facility];  Service: General;  Laterality: N/A;   TONSILLECTOMY     No family history on file. Social History   Socioeconomic History   Marital status: Single    Spouse name: Not on file   Number of children: Not on file   Years of education: Not on file   Highest education level: Not on file  Occupational History   Not on file  Tobacco Use   Smoking status: Former    Packs/day: 1.50    Years: 3.00    Additional pack years: 0.00    Total pack years: 4.50    Types: Cigarettes    Quit date: 12/24/1974    Years since quitting: 48.0   Smokeless tobacco: Never  Vaping Use   Vaping Use: Never used  Substance and Sexual Activity   Alcohol use: Not Currently   Drug use: Never   Sexual activity: Not Currently  Other Topics Concern   Not on file  Social History Narrative   Not on file   Social Determinants of Health   Financial Resource Strain: Not on file  Food Insecurity: No Food Insecurity (07/27/2022)   Hunger Vital Sign    Worried About Running Out of Food in the Last Year: Never true    Ran Out of Food in the Last  Year: Never true  Transportation Needs: No Transportation Needs (07/27/2022)   PRAPARE - Administrator, Civil Service (Medical): No    Lack of Transportation (Non-Medical): No  Physical Activity: Not on file  Stress: Not on file  Social Connections: Not on file    Tobacco Counseling Counseling given: Not Answered   Clinical Intake:                 Diabetic?No          Activities of Daily Living    07/27/2022    1:16 PM 07/20/2022    8:10 AM  In your present state of health, do you have any difficulty performing the following activities:  Hearing? 0   Vision? 0   Difficulty concentrating or making decisions? 0   Walking or climbing stairs? 0   Dressing or bathing? 0   Doing errands, shopping? 0 0    Patient Care Team: Marcine Matar, MD as PCP - General  (Internal Medicine)  Indicate any recent Medical Services you may have received from other than [facility] providers in the past year (date may be approximate).     Assessment:   This is a routine wellness examination for Tyler Wise.  Hearing/Vision screen No results found.  Dietary issues and exercise activities discussed:     Goals Addressed   None    Depression Screen    09/05/2022    8:58 AM 12/30/2021   11:50 AM 12/30/2021   11:38 AM 09/29/2021   11:36 AM 05/07/2021    9:00 AM  PHQ 2/9 Scores  PHQ - 2 Score 0 0 0 0 0  PHQ- 9 Score 0 0 0      Fall Risk    04/29/2022    2:01 PM 09/29/2021   11:36 AM 05/07/2021    9:00 AM  Fall Risk   Falls in the past year? 0 0 0  Number falls in past yr: 0 0 0  Injury with Fall? 0 0 0  Risk for fall due to : No Fall Risks No Fall Risks No Fall Risks  Follow up Falls evaluation completed      FALL RISK PREVENTION PERTAINING TO THE HOME:  Any stairs in or around the home? {YES/NO:21197} If so, are there any without handrails? {YES/NO:21197} Home free of loose throw rugs in walkways, pet beds, electrical cords, etc? {YES/NO:21197} Adequate lighting in your home to reduce risk of falls? {YES/NO:21197}  ASSISTIVE DEVICES UTILIZED TO PREVENT FALLS:  Life alert? {YES/NO:21197} Use of a cane, walker or w/c? {YES/NO:21197} Grab bars in the bathroom? {YES/NO:21197} Shower chair or bench in shower? {YES/NO:21197} Elevated toilet seat or a handicapped toilet? {YES/NO:21197}  TIMED UP AND GO:  Was the test performed? No . Telephonic visit   Cognitive Function:    09/05/2022    9:02 AM  MMSE - Mini Mental State Exam  Orientation to time 5  Orientation to Place 5  Registration 3  Attention/ Calculation 5  Recall 3  Language- name 2 objects 2  Language- repeat 1  Language- follow 3 step command 3  Language- read & follow direction 1  Write a sentence 1  Copy design 1  Total score 30        Immunizations Immunization History   Administered Date(s) Administered   Influenza,inj,Quad PF,6+ Mos 05/07/2021, 04/01/2022   Janssen (J&J) SARS-COV-2 Vaccination 12/01/2019, 06/23/2020   PNEUMOCOCCAL CONJUGATE-20 05/10/2021   Tdap 05/07/2021   Zoster Recombinat (Shingrix) 04/07/2020, 06/07/2020  TDAP status: Up to date  Pneumococcal vaccine status: Up to date  Covid-19 vaccine status: Information provided on how to obtain vaccines.   Qualifies for Shingles Vaccine? Yes   Zostavax completed No   Shingrix Completed?: Yes  Screening Tests Health Maintenance  Topic Date Due   Hepatitis C Screening  Never done   Colonoscopy  Never done   COVID-19 Vaccine (3 - 2023-24 season) 03/25/2022   Medicare Annual Wellness (AWV)  09/13/2022   Diabetic kidney evaluation - Urine ACR  12/31/2022   INFLUENZA VACCINE  02/23/2023   Diabetic kidney evaluation - eGFR measurement  09/06/2023   DTaP/Tdap/Td (2 - Td or Tdap) 05/08/2031   Pneumonia Vaccine 55+ Years old  Completed   Zoster Vaccines- Shingrix  Completed   HPV VACCINES  Aged Out    Health Maintenance  Health Maintenance Due  Topic Date Due   Hepatitis C Screening  Never done   Colonoscopy  Never done   COVID-19 Vaccine (3 - 2023-24 season) 03/25/2022   Medicare Annual Wellness (AWV)  09/13/2022   Diabetic kidney evaluation - Urine ACR  12/31/2022    {Colorectal cancer screening:2101809}  Lung Cancer Screening: (Low Dose CT Chest recommended if Age 39-80 years, 30 pack-year currently smoking OR have quit w/in 15years.) does not qualify.   Lung Cancer Screening Referral: n/a  Additional Screening:  Hepatitis C Screening: does qualify; Completed ***  Vision Screening: Recommended annual ophthalmology exams for early detection of glaucoma and other disorders of the eye. Is the patient up to date with their annual eye exam?  {YES/NO:21197} Who is the provider or what is the name of the office in which the patient attends annual eye exams? *** If pt is not  established with a provider, would they like to be referred to a provider to establish care? {YES/NO:21197}.   Dental Screening: Recommended annual dental exams for proper oral hygiene  Community Resource Referral / Chronic Care Management: CRR required this visit?  {YES/NO:21197}  CCM required this visit?  {YES/NO:21197}     Plan:     I have personally reviewed and noted the following in the patient's chart:   Medical and social history Use of alcohol, tobacco or illicit drugs  Current medications and supplements including opioid prescriptions. {Opioid [phone]} Functional ability and status Nutritional status Physical activity Advanced directives List of other physicians Hospitalizations, surgeries, and ER visits in previous 12 months Vitals Screenings to include cognitive, depression, and falls Referrals and appointments  In addition, I have reviewed and discussed with patient certain preventive protocols, quality metrics, and best practice recommendations. A written personalized care plan for preventive services as well as general preventive health recommendations were provided to patient.     Durwin Nora, [place]   2/72/5366   Due to this being a virtual visit, the after visit summary with patients personalized plan was offered to patient via mail or my-chart. ***Patient declined at this time./ Patient would like to access on my-chart/ per request, patient was mailed a copy of AVS./ Patient preferred to pick up at office at next visit   Nurse Notes: ***

## 2023-01-03 ENCOUNTER — Ambulatory Visit: Payer: Medicare HMO | Attending: Internal Medicine

## 2023-01-03 VITALS — Ht 69.0 in | Wt 241.0 lb

## 2023-01-03 DIAGNOSIS — Z0000 Encounter for general adult medical examination without abnormal findings: Secondary | ICD-10-CM

## 2023-01-04 ENCOUNTER — Ambulatory Visit: Payer: Medicare HMO | Admitting: Internal Medicine

## 2023-01-05 ENCOUNTER — Ambulatory Visit: Payer: Medicare HMO | Admitting: Internal Medicine

## 2023-02-16 ENCOUNTER — Other Ambulatory Visit: Payer: Self-pay | Admitting: Internal Medicine

## 2023-02-16 DIAGNOSIS — I10 Essential (primary) hypertension: Secondary | ICD-10-CM

## 2023-02-16 NOTE — Telephone Encounter (Signed)
Requested Prescriptions  Pending Prescriptions Disp Refills   amLODipine (NORVASC) 10 MG tablet [Pharmacy Med Name: AMLODIPINE BESYLATE 10 MG Tablet] 90 tablet 0    Sig: TAKE 1 TABLET EVERY DAY     Cardiovascular: Calcium Channel Blockers 2 Passed - 02/16/2023 10:52 AM      Passed - Last BP in normal range    BP Readings from Last 1 Encounters:  09/05/22 124/81         Passed - Last Heart Rate in normal range    Pulse Readings from Last 1 Encounters:  09/05/22 85         Passed - Valid encounter within last 6 months    Recent Outpatient Visits           5 months ago Diabetes mellitus type 2 in obese [facility])   [place] [facility] Jonah Blue B, MD   9 months ago Diabetes mellitus type 2 in obese [facility])   [place] [facility] Jonah Blue B, MD   1 year ago Type 2 diabetes mellitus with hyperlipidemia [facility])   [place] [facility] Jonah Blue B, MD   1 year ago Iron deficiency anemia due to chronic blood loss   [place] [facility] [place], Marzella Schlein, [place]   1 year ago Encounter to establish care   [place] [facility] Jonah Blue B, MD               Cholecalciferol (VITAMIN D3) 10 MCG (400 UNIT) tablet [Pharmacy Med Name: VITAMIN D3 10 MCG (400 UNIT) Tablet] 90 tablet 2    Sig: TAKE 1 TABLET EVERY DAY     Endocrinology:  Vitamins - Vitamin D Supplementation 2 Failed - 02/16/2023 10:52 AM      Failed - Manual Review: Route requests for 50,000 IU strength to the provider      Failed - Vitamin D in normal range and within 360 days    Vit D, 25-Hydroxy  Date Value Ref Range Status  09/05/2022 23.0 (L) 30.0 - 100.0 ng/mL Final    Comment:    Vitamin D deficiency has been defined by the Institute of Medicine and an Endocrine Society practice guideline as a level of serum 25-OH vitamin D less than 20 ng/mL (1,2). The Endocrine  Society went on to further define vitamin D insufficiency as a level between 21 and 29 ng/mL (2). 1. IOM (Institute of Medicine). 2010. Dietary reference    intakes for calcium and D. [place]: The    [employer]. 2. Holick MF, Binkley [place], Bischoff-Ferrari HA, et al.    Evaluation, treatment, and prevention of vitamin D    deficiency: an Endocrine Society clinical practice    guideline. JCEM. 2011 Jul; 96(7):1911-30.          Passed - Ca in normal range and within 360 days    Calcium  Date Value Ref Range Status  09/05/2022 9.7 8.6 - 10.2 mg/dL Final         Passed - Valid encounter within last 12 months    Recent Outpatient Visits           5 months ago Diabetes mellitus type 2 in obese [facility])   [place] [facility] Jon[facility] Blue B, MD   9 months ago Diabetes mellitus type 2 in obese [facility])    [facility]  Center Marcine Matar, MD   1 year ago Type 2 diabetes mellitus with hyperlipidemia [facility])   [place] [facility] Jonah Blue B, MD   1 year ago Iron deficiency anemia due to chronic blood loss   [facility] [place], Marzella Schlein, [place]   1 year ago Encounter to establish care   [facility] Marcine Matar, MD

## 2023-03-10 ENCOUNTER — Other Ambulatory Visit: Payer: Self-pay | Admitting: Internal Medicine

## 2023-03-10 DIAGNOSIS — E1165 Type 2 diabetes mellitus with hyperglycemia: Secondary | ICD-10-CM

## 2023-03-10 MED ORDER — METFORMIN HCL 500 MG PO TABS
500.0000 mg | ORAL_TABLET | Freq: Two times a day (BID) | ORAL | 3 refills | Status: AC
Start: 2023-03-10 — End: ?

## 2023-03-10 MED ORDER — ATORVASTATIN CALCIUM 20 MG PO TABS: 20.0000 mg | ORAL_TABLET | Freq: Every day | ORAL | 1 refills | Status: DC

## 2023-03-22 ENCOUNTER — Other Ambulatory Visit: Payer: Self-pay | Admitting: Internal Medicine

## 2023-04-23 ENCOUNTER — Other Ambulatory Visit: Payer: Self-pay | Admitting: Internal Medicine

## 2023-04-24 NOTE — Telephone Encounter (Signed)
Requested medication (s) are due for refill today: yes  Requested medication (s) are on the active medication list: yes  Last refill:  03/22/23 #60/0  Future visit scheduled: no  Notes to clinic:  Unable to refill per protocol due to failed labs, no updated results. No PSA on file      Requested Prescriptions  Pending Prescriptions Disp Refills   tamsulosin (FLOMAX) 0.4 MG CAPS capsule [Pharmacy Med Name: Tamsulosin HCl Oral Capsule 0.4 MG] 60 capsule 11    Sig: TAKE 2 CAPSULES EVERY DAY (NEED MD APPOINTMENT FOR REFILLS)     Urology: Alpha-Adrenergic Blocker Failed - 04/23/2023 11:04 PM      Failed - PSA in normal range and within 360 days    No results found for: "LABPSA", "PSA", "PSA1", "ULTRAPSA"       Passed - Last BP in normal range    BP Readings from Last 1 Encounters:  09/05/22 124/81         Passed - Valid encounter within last 12 months    Recent Outpatient Visits           7 months ago Diabetes mellitus type 2 in obese [facility])   [place] [facility] Jon[facility] Blue B, MD   12 months ago Diabetes mellitus type 2 in obese [facility])   [place] [facility] Jonah Blue B, MD   1 year ago Type 2 diabetes mellitus with hyperlipidemia [facility])   [place] [facility] Jon[facility] Blue B, MD   1 year ago Iron deficiency anemia due to chronic blood loss   [facility] [place], [place]zella [place]lein, [place]   1 year ago Encounter to establish care   [facility] Marcine Matar, MD

## 2023-05-08 ENCOUNTER — Other Ambulatory Visit: Payer: Self-pay | Admitting: Internal Medicine

## 2023-05-08 DIAGNOSIS — I10 Essential (primary) hypertension: Secondary | ICD-10-CM

## 2023-06-10 ENCOUNTER — Other Ambulatory Visit: Payer: Self-pay | Admitting: Internal Medicine

## 2023-06-10 DIAGNOSIS — I10 Essential (primary) hypertension: Secondary | ICD-10-CM

## 2023-06-12 NOTE — Telephone Encounter (Signed)
Requested medication (s) are due for refill today: yes   Requested medication (s) are on the active medication list: yes   Last refill:  05/09/23 #30 0 refills  Future visit scheduled: no   Notes to clinic:  do you want to give 2nd courtesy refill? Called patient to schedule appt no answer LVMTCB.      Requested Prescriptions  Pending Prescriptions Disp Refills   amLODipine (NORVASC) 10 MG tablet [Pharmacy Med Name: amLODIPine Besylate Oral Tablet 10 MG] 30 tablet 11    Sig: TAKE 1 TABLET EVERY DAY (MUST HAVE OFFICE VISIT FOR REFILLS)     Cardiovascular: Calcium Channel Blockers 2 Failed - 06/10/2023 12:34 PM      Failed - Valid encounter within last 6 months    Recent Outpatient Visits           9 months ago Diabetes mellitus type 2 in obese [facility])   [place] [facility] [place]. [facility] Marcine Matar, MD   1 year ago Diabetes mellitus type 2 in obese [facility])   [place] [facility] [place]. [facility] Marcine Matar, MD   1 year ago Type 2 diabetes mellitus with hyperlipidemia [facility])   [place] [facility] [place]. [facility] Jonah Blue B, MD   1 year ago Iron deficiency anemia due to chronic blood loss   [place] [facility] [place]. [facility] [place], Marzella Schlein, [place]   2 years ago Encounter to establish care   [place] [facility] . [facility] Marcine Matar, MD              Passed - Last BP in normal range    BP Readings from Last 1 Encounters:  09/05/22 124/81         Passed - Last Heart Rate in normal range    Pulse Readings from Last 1 Encounters:  09/05/22 85

## 2023-06-12 NOTE — Telephone Encounter (Signed)
Called patient to schedule appt for medication refills. No answer on [phone]. LVMTCB [phone].

## 2023-06-14 ENCOUNTER — Other Ambulatory Visit: Payer: Self-pay | Admitting: Internal Medicine

## 2023-06-14 NOTE — Telephone Encounter (Signed)
Medication Refill -  Most Recent Primary Care Visit:  Provider: Anthoney Harada  Department: [facility]  Visit Type: MEDICARE AWV, INITIAL  Date: 01/03/2023 Medication: tamsulosin (FLOMAX) 0.4 MG CAPS capsu   Has the patient contacted their pharmacy? Yes   Is this the correct pharmacy for this prescription? Yes If no, delete pharmacy and type the correct one.  This is the patient's preferred pharmacy:   [facility] - [place], [place] - [address] [place] [address] Phone: [phone] Fax: [phone]   Has the prescription been filled recently? Yes  Is the patient out of the medication? Yes  Has the patient been seen for an appointment in the last year OR does the patient have an upcoming appointment? Yes  Can we respond through MyChart? No  Agent: Please be advised that Rx refills may take up to 3 business days. We ask that you follow-up with your pharmacy.

## 2023-06-15 NOTE — Telephone Encounter (Signed)
Requested medication (s) are due for refill today: yes  Requested medication (s) are on the active medication list: yes  Last refill:  03/22/23 #60  Future visit scheduled: no  Notes to clinic:  called pt and LM on VM to make appt. Overdue labs    Requested Prescriptions  Pending Prescriptions Disp Refills   tamsulosin (FLOMAX) 0.4 MG CAPS capsule 60 capsule 0    Sig: Take 2 capsules (0.8 mg total) by mouth daily.     Urology: Alpha-Adrenergic Blocker Failed - 06/14/2023 10:14 AM      Failed - PSA in normal range and within 360 days    No results found for: "LABPSA", "PSA", "PSA1", "ULTRAPSA"       Passed - Last BP in normal range    BP Readings from Last 1 Encounters:  09/05/22 124/81         Passed - Valid encounter within last 12 months    Recent Outpatient Visits           9 months ago Diabetes mellitus type 2 in obese [facility])   [place] [facility] [place]. [facility] Marcine Matar, MD   1 year ago Diabetes mellitus type 2 in obese [facility])   [place] [facility] [place]. [facility] Marcine Matar, MD   1 year ago Type 2 diabetes mellitus with hyperlipidemia [facility])   [place] [facility] - [facility] Dept Of [place]. [facility] Jonah Blue B, MD   1 year ago Iron deficiency anemia due to chronic blood loss   [place] [facility] [place] [facility] [place]. [facility] [place], Marzella [place]lein, [place]   2 years ago Encounter to establish care   [place] [facility] [place] [facility] Of [place]. [facility] Marcine Matar, MD

## 2023-08-28 ENCOUNTER — Other Ambulatory Visit: Payer: Self-pay | Admitting: Internal Medicine

## 2023-09-19 ENCOUNTER — Other Ambulatory Visit: Payer: Self-pay | Admitting: Internal Medicine

## 2023-11-09 ENCOUNTER — Other Ambulatory Visit: Payer: Self-pay | Admitting: Internal Medicine
